# Patient Record
Sex: Female | Born: 1980 | Race: White | Hispanic: No | Marital: Married | Smoking: Never smoker
Health system: Southern US, Community
[De-identification: ages and names within clinical notes are randomized; demographics above are authoritative.]

## PROBLEM LIST (undated history)

## (undated) DIAGNOSIS — E559 Vitamin D deficiency, unspecified: Secondary | ICD-10-CM

## (undated) DIAGNOSIS — I10 Essential (primary) hypertension: Secondary | ICD-10-CM

## (undated) DIAGNOSIS — F32A Depression, unspecified: Secondary | ICD-10-CM

## (undated) DIAGNOSIS — F419 Anxiety disorder, unspecified: Secondary | ICD-10-CM

## (undated) DIAGNOSIS — D649 Anemia, unspecified: Secondary | ICD-10-CM

## (undated) HISTORY — PX: TONSILLECTOMY: SUR1361

## (undated) HISTORY — PX: MYRINGOTOMY: SHX2060

---
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
## 2009-08-16 ENCOUNTER — Ambulatory Visit: Payer: Self-pay | Admitting: Family Medicine

## 2009-08-16 DIAGNOSIS — J45909 Unspecified asthma, uncomplicated: Secondary | ICD-10-CM | POA: Insufficient documentation

## 2009-08-16 DIAGNOSIS — J069 Acute upper respiratory infection, unspecified: Secondary | ICD-10-CM | POA: Insufficient documentation

## 2009-08-16 DIAGNOSIS — I10 Essential (primary) hypertension: Secondary | ICD-10-CM | POA: Insufficient documentation

## 2009-10-28 ENCOUNTER — Emergency Department (HOSPITAL_BASED_OUTPATIENT_CLINIC_OR_DEPARTMENT_OTHER): Admission: EM | Admit: 2009-10-28 | Discharge: 2009-10-28 | Payer: Self-pay | Admitting: Emergency Medicine

## 2010-09-20 ENCOUNTER — Ambulatory Visit (INDEPENDENT_AMBULATORY_CARE_PROVIDER_SITE_OTHER): Payer: 59 | Admitting: Emergency Medicine

## 2010-09-20 ENCOUNTER — Encounter: Payer: Self-pay | Admitting: Emergency Medicine

## 2010-09-20 DIAGNOSIS — J069 Acute upper respiratory infection, unspecified: Secondary | ICD-10-CM

## 2010-09-27 NOTE — Assessment & Plan Note (Signed)
Summary: Possible Sinus Infection/NH  History of Present Illness History from: patient History of Present Illness: 30 Years Old Female complains of onset of cold symptoms for 5 days.  Teresa Navarro has been using Mucinex & Delsym which is helping a little bit. ++ sore throat + cough No pleuritic pain No wheezing + nasal congestion + post-nasal drainage ++ sinus pain/pressure No chest congestion No itchy/red eyes No earache No hemoptysis No SOB No chills/sweats No fever + nausea + vomiting No abdominal pain No diarrhea No skin rashes No fatigue No myalgias No headache     Allergies: 1)  ! * Seasonal  Physical Exam  General:  Well-developed,well-nourished,in no acute distress; alert,appropriate and cooperative throughout examination Head:  maxillary sinus tenderness Ears:  External ear exam shows no significant lesions or deformities.  Otoscopic examination reveals clear canals, tympanic membranes are intact bilaterally without bulging, retraction, inflammation or discharge. Hearing is grossly normal bilaterally. Nose:  Clear discharge Mouth:  OP mild erythema, no exudates, +clear PND Neck:  tender ant cerv LAD Lungs:  Normal respiratory effort, chest expands symmetrically. Lungs are clear to auscultation, no crackles or wheezes. Heart:  Normal rate and regular rhythm. S1 and S2 normal without gallop, murmur, click, rub or other extra sounds.  Assessment  Assessed URI as comment only - Hoyt Koch MD New Problems: URI (ICD-465.9)   Plan New Medications/Changes: AMOXICILLIN 875 MG TABS (AMOXICILLIN) 1 by mouth two times a day fro 7 days  #14 x 0, 09/20/2010, Hoyt Koch MD  New Orders: Est. Patient Level III [phone]  The patient and/or caregiver has been counseled thoroughly with regard to medications prescribed including dosage, schedule, interactions, rationale for use, and possible side effects and they verbalize understanding.  Diagnoses and expected  course of recovery discussed and will return if not improved as expected or if the condition worsens. Patient and/or caregiver verbalized understanding.    Impression & Recommendations:  Problem # 1:  URI (ICD-465.9) 1)  Take the prescribed antibiotic as instructed. 2)  Use nasal saline solution (over the counter) at least 3 times a day. 3)  Use over the counter decongestants like Zyrtec-D every 12 hours as needed to help with congestion. 4)  Can take tylenol every 6 hours or motrin every 8 hours for pain or fever. 5)  Follow up with your primary doctor  if no improvement in 5-7 days, sooner if increasing pain, fever, or new symptoms.    Complete Medication List: 1)  Propranolol Hcl 80 Mg Tabs (Propranolol hcl) .... Bid 2)  Azithromycin 250 Mg Tabs (Azithromycin) .... Two tabs by mouth on day 1, then 1 tab daily on days 2 through 5 3)  Amoxicillin 875 Mg Tabs (Amoxicillin) .Marland Kitchen.. 1 by mouth two times a day fro 7 days Prescriptions: AMOXICILLIN 875 MG TABS (AMOXICILLIN) 1 by mouth two times a day fro 7 days  #14 x 0   Entered and Authorized by:   Hoyt Koch MD   Signed by:   Hoyt Koch MD on 09/20/2010   Method used:   Print then Give to Patient   RxID:   [id number]    Orders Added: 1)  Est. Patient Level III [95621]  Appended Document: Possible Sinus Infection/NH Vital sign: BP- 141/84 LT arm LC sitting P-101 T-98.5 R-18  HT- 63" WT- 211.8  Hx of Diabetes, HBP  Fam Hx of : diabetes, HBP, MI  No current meds No allergies

## 2010-12-12 ENCOUNTER — Inpatient Hospital Stay (INDEPENDENT_AMBULATORY_CARE_PROVIDER_SITE_OTHER)
Admission: RE | Admit: 2010-12-12 | Discharge: 2010-12-12 | Disposition: A | Discharge status: Home / Self Care | Payer: 59 | Source: Ambulatory Visit | Attending: Family Medicine | Admitting: Family Medicine

## 2010-12-12 ENCOUNTER — Encounter: Payer: Self-pay | Admitting: Family Medicine

## 2010-12-12 DIAGNOSIS — H6690 Otitis media, unspecified, unspecified ear: Secondary | ICD-10-CM

## 2010-12-12 DIAGNOSIS — J069 Acute upper respiratory infection, unspecified: Secondary | ICD-10-CM

## 2011-07-23 NOTE — Progress Notes (Signed)
Summary: POSSIBLE EAR INFECTION LEFT SIDE? NH rm 4   Vital Signs:  Patient Profile:   30 Years Old Female CC:      cold s/s, L T ear ache x 1 day Height:     63 inches Weight:      211.75 pounds O2 Sat:      99 % O2 treatment:    Room Air Temp:     98.5 degrees F oral Pulse rate:   109 / minute Resp:     18 per minute BP sitting:   144 / 95  (left arm) Cuff size:   large  Vitals Entered By: Clemens Catholic LPN (December 12, 2010 12:53 PM)                  Updated Prior Medication List: No Medications Current Allergies (reviewed today): ! * SEASONALHistory of Present Illness Chief Complaint: cold s/s, L T ear ache x 1 day History of Present Illness:  Subjective: Patient complains of mild URI symptoms for about 4 days.  At 4:30 PM yesterday she developed a left earache + mild sore throat this morning + cough No pleuritic pain No wheezing + nasal congestion ? post-nasal drainage + sinus pain/pressure No itchy/red eyes No hemoptysis No SOB No fever/chills No nausea No vomiting No abdominal pain No diarrhea No skin rashes + fatigue No myalgias No headache    REVIEW OF SYSTEMS Constitutional Symptoms      Denies fever, chills, night sweats, weight loss, weight gain, and fatigue.  Eyes       Denies change in vision, eye pain, eye discharge, glasses, contact lenses, and eye surgery. Ear/Nose/Throat/Mouth       Complains of change in hearing, ear pain, and sore throat.      Denies hearing loss/aids, ear discharge, dizziness, frequent runny nose, frequent nose bleeds, sinus problems, hoarseness, and tooth pain or bleeding.      Comments: hx of ear tubes Respiratory       Complains of dry cough.      Denies productive cough, wheezing, shortness of breath, asthma, bronchitis, and emphysema/COPD.  Cardiovascular       Denies murmurs, chest pain, and tires easily with exhertion.    Gastrointestinal       Denies stomach pain, nausea/vomiting, diarrhea, constipation,  blood in bowel movements, and indigestion. Genitourniary       Denies painful urination, kidney stones, and loss of urinary control. Neurological       Denies paralysis, seizures, and fainting/blackouts. Musculoskeletal       Denies muscle pain, joint pain, joint stiffness, decreased range of motion, redness, swelling, muscle weakness, and gout.  Skin       Denies bruising, unusual mles/lumps or sores, and hair/skin or nail changes.  Psych       Denies mood changes, temper/anger issues, anxiety/stress, speech problems, depression, and sleep problems. Other Comments: pt c/o cold s/s, runny nose, head congestion, dry cough, LT ear ache x 1 day. she has a hx of ear infections. she applied a heating pad to her LT ear and took IBF last night.    Past History:  Past Medical History: Reviewed history from 08/16/2009 and no changes required. Asthma-exercise induced Hypertension  Past Surgical History: tubes in ears 89  Tonsillectomy  Family History: Reviewed history from 08/16/2009 and no changes required. Family History Diabetes 1st degree relative-mother Family History Hypertension-mother and father Family History of Cardiovascular disorder  Social History: Single Never Smoked Drug use-no Alcohol  use-no   Objective:  No acute distress  Eyes:  Pupils are equal, round, and reactive to light and accomdation.  Extraocular movement is intact.  Conjunctivae are not inflamed.  Ears:  Canals normal.  Right tympanic membrane normal.  Left tympanic membrane red, bulging, and scarred Nose:   Congested turbinates, worse on left.  No sinus tenderness   Pharynx:  Normal  Neck:  Supple.   Tender shotty posterior nodes are palpated bilaterally.  Lungs:  Clear to auscultation.  Breath sounds are equal.  Heart:  Regular rate and rhythm without murmurs, rubs, or gallops.  Abdomen:  Nontender without masses or hepatosplenomegaly.  Bowel sounds are present.  No CVA or flank tenderness.    Assessment New Problems: UPPER RESPIRATORY INFECTION, ACUTE (ICD-465.9) OTITIS MEDIA, ACUTE, LEFT (ICD-382.9)   Plan New Medications/Changes: NASONEX 50 MCG/ACT SUSP (MOMETASONE FUROATE) Two sprays in each nostril once a day  #17gm x 1, 12/12/2010, Donna Christen MD AMOXICILLIN 875 MG TABS (AMOXICILLIN) One by mouth two times a day  #20 x 0, 12/12/2010, Donna Christen MD  New Orders: Pulse Oximetry (single measurment) [94760] Est. Patient Level III [96045] Planning Comments:   Begin amoxicillin, expectorant/decongestant, cough suppressant at bedtime, Nasonex nasal spray.  Increase fluid intake Followup with ENT if not improving 7 to 10 days   The patient and/or caregiver has been counseled thoroughly with regard to medications prescribed including dosage, schedule, interactions, rationale for use, and possible side effects and they verbalize understanding.  Diagnoses and expected course of recovery discussed and will return if not improved as expected or if the condition worsens. Patient and/or caregiver verbalized understanding.  Prescriptions: NASONEX 50 MCG/ACT SUSP (MOMETASONE FUROATE) Two sprays in each nostril once a day  #17gm x 1   Entered and Authorized by:   Donna Christen MD   Signed by:   Donna Christen MD on 12/12/2010   Method used:   Print then Give to Patient   RxID:   [id number] AMOXICILLIN 875 MG TABS (AMOXICILLIN) One by mouth two times a day  #20 x 0   Entered and Authorized by:   Donna Christen MD   Signed by:   Donna Christen MD on 12/12/2010   Method used:   Print then Give to Patient   RxID:   [id number]   Patient Instructions: 1)  Take Mucinex D (guaifenesin with decongestant) twice daily for congestion. 2)  Increase fluid intake, rest. 3)  May use Afrin nasal spray (or generic oxymetazoline) twice daily for about 5 days.  Also recommend using saline nasal spray several times daily and/or saline nasal irrigation.  Use Nasonex spray after Afrin and  saline spray. 4)  If cough develops at night, may use Delsym Cough Suppressat at bedtime. 5)  Followup with ENT doctor if not improving 7 to 10 days.   Orders Added: 1)  Pulse Oximetry (single measurment) [94760] 2)  Est. Patient Level III [84132]

## 2013-01-15 ENCOUNTER — Encounter: Payer: Self-pay | Admitting: *Deleted

## 2013-01-15 ENCOUNTER — Emergency Department (INDEPENDENT_AMBULATORY_CARE_PROVIDER_SITE_OTHER)
Admission: EM | Admit: 2013-01-15 | Discharge: 2013-01-15 | Disposition: A | Discharge status: Home / Self Care | Payer: 59 | Source: Home / Self Care | Attending: Family Medicine | Admitting: Family Medicine

## 2013-01-15 DIAGNOSIS — J069 Acute upper respiratory infection, unspecified: Secondary | ICD-10-CM

## 2013-01-15 HISTORY — DX: Essential (primary) hypertension: I10

## 2013-01-15 MED ORDER — BENZONATATE 200 MG PO CAPS: 200.0000 mg | ORAL_CAPSULE | Freq: Every day | ORAL | Status: AC

## 2013-01-15 MED ORDER — AMOXICILLIN 875 MG PO TABS: 875.0000 mg | ORAL_TABLET | Freq: Two times a day (BID) | ORAL | Status: AC

## 2013-01-15 MED ORDER — PREDNISONE 20 MG PO TABS: 20.0000 mg | ORAL_TABLET | Freq: Two times a day (BID) | ORAL | Status: AC

## 2013-01-15 NOTE — ED Notes (Signed)
Teresa Navarro c/o cough/congestion x 5 days. Her right ear began hurting x 2 days. Denies fever/

## 2013-01-15 NOTE — ED Provider Notes (Signed)
History     CSN: [id number]  Arrival date & time 01/15/13  1436   First MD Initiated Contact with Patient 01/15/13 1446      Chief Complaint  Patient presents with  . Cough  . Otalgia       HPI Comments: Patient developed hoarseness and sinus congestion about 6 days ago.  Minimal sore throat.  She developed a mild cough about 2.3 days ago.  No fever.  She now has a sensation of fullness in her right ear. She states that she had been treated for hypertension in the past but did not remain on the medication.  She notes that she has a history of "white coat hypertension."  She also has a past history of mild asthma.  The history is provided by the patient.    Past Medical History  Diagnosis Date  . Hypertension     untrerated    Past Surgical History  Procedure Laterality Date  . Myringotomy    . Tonsillectomy      Family History  Problem Relation Age of Onset  . Diabetes Mother   . Hypertension Father     History  Substance Use Topics  . Smoking status: Never Smoker   . Smokeless tobacco: Never Used  . Alcohol Use: No    OB History   Grav Para Term Preterm Abortions TAB SAB Ect Mult Living                  Review of Systems + mild sore throat + cough No pleuritic pain No wheezing + nasal congestion + post-nasal drainage No sinus pain/pressure No itchy/red  eyes ? right earache No hemoptysis No SOB No fever, + chills No nausea No vomiting No abdominal pain No diarrhea No urinary symptoms No skin rashes + fatigue No myalgias No headache Used OTC meds without relief  Allergies  Review of patient's allergies indicates no known allergies.  Home Medications   Current Outpatient Rx  Name  Route  Sig  Dispense  Refill  . amoxicillin (AMOXIL) 875 MG tablet   Oral   Take 1 tablet (875 mg total) by mouth 2 (two) times daily. (Rx void after 01/22/13)   14 tablet   0   . benzonatate (TESSALON) 200 MG capsule   Oral   Take 1 capsule (200 mg total) by mouth at bedtime.   12 capsule   0   . predniSONE (DELTASONE) 20 MG tablet   Oral   Take 1 tablet (20 mg total) by mouth 2 (two) times daily. Take with food.   10 tablet   0     BP 159/106  Pulse 102  Temp(Src) 98.2 F (36.8 C) (Oral)  Resp 16  Ht 5\' 3"  (1.6 m)  Wt 220 lb (99.791 kg)  BMI 38.98 kg/m2  SpO2 99%  LMP 12/27/2012  Physical Exam Nursing notes and Vital Signs reviewed. Appearance:  Patient appears stated age, and in no acute distress.  Patient is obese (BMI 39.0) Eyes:  Pupils are equal, round, and reactive to light and accomodation.  Extraocular movement is intact.  Conjunctivae are not inflamed  Ears:  Canals normal.  Tympanic membranes somewhat scarred but otherwise normal.  Nose:  Moderately congested turbinates.  No sinus tenderness.   Pharynx:  Normal Neck:  Supple.  Slightly tender shotty anterior/posterior nodes are palpated bilaterally  Lungs:  Clear to auscultation.  Breath sounds are equal.  Heart:  Regular rate and rhythm without murmurs, rubs, or gallops.  Abdomen:  Nontender without masses or hepatosplenomegaly.  Bowel sounds are present.  No CVA or flank tenderness.  Extremities:  No edema.  No calf tenderness Skin:  No rash present.   ED Course  Procedures  none  Labs Reviewed -  Tympanogram high peak height left ear; normal right  ear.    1. Acute upper respiratory infections of unspecified site       MDM  There is no evidence of bacterial infection today.   Begin prednisone burst.  Prescription written for Benzonatate (Tessalon) to take at bedtime for night-time cough.  Take plain Mucinex (guaifenesin) twice daily for cough and congestion.  Increase fluid intake, rest. May use Afrin nasal spray (or generic oxymetazoline) twice daily for about 5  days.  Also recommend using saline nasal spray several times daily and saline nasal irrigation (AYR is a common brand) Stop all antihistamines for now, and other non-prescription cough/cold preparations. Begin Amoxicillin if not improving about one week, if earache develops, or if persistent fever develops (Given a prescription to hold, with an expiration date)  Follow-up with family doctor if not improving about 10 days.  Check Blood pressure several times weekly and follow-up with family doctor if remains over 120/80.        Lattie Haw, MD 01/15/13 [phone]

## 2019-12-27 ENCOUNTER — Other Ambulatory Visit: Payer: Self-pay

## 2019-12-27 ENCOUNTER — Encounter (HOSPITAL_COMMUNITY): Payer: Self-pay | Admitting: Emergency Medicine

## 2019-12-27 ENCOUNTER — Emergency Department (HOSPITAL_COMMUNITY)
Admission: EM | Admit: 2019-12-27 | Discharge: 2019-12-28 | Disposition: A | Discharge status: Home / Self Care | Payer: BC Managed Care – PPO | Attending: Emergency Medicine | Admitting: Emergency Medicine

## 2019-12-27 DIAGNOSIS — K529 Noninfective gastroenteritis and colitis, unspecified: Secondary | ICD-10-CM | POA: Insufficient documentation

## 2019-12-27 DIAGNOSIS — R109 Unspecified abdominal pain: Secondary | ICD-10-CM | POA: Diagnosis not present

## 2019-12-27 DIAGNOSIS — R112 Nausea with vomiting, unspecified: Secondary | ICD-10-CM | POA: Diagnosis present

## 2019-12-27 LAB — CBC
HCT: 44.5 % (ref 36.0–46.0)
Hemoglobin: 14.6 g/dL (ref 12.0–15.0)
MCH: 30.5 pg (ref 26.0–34.0)
MCHC: 32.8 g/dL (ref 30.0–36.0)
MCV: 93.1 fL (ref 80.0–100.0)
Platelets: 249 10*3/uL (ref 150–400)
RBC: 4.78 MIL/uL (ref 3.87–5.11)
RDW: 12.2 % (ref 11.5–15.5)
WBC: 9.4 10*3/uL (ref 4.0–10.5)
nRBC: 0 % (ref 0.0–0.2)

## 2019-12-27 LAB — I-STAT BETA HCG BLOOD, ED (MC, WL, AP ONLY): I-stat hCG, quantitative: <5 m[IU]/mL (ref <5)

## 2019-12-27 MED ORDER — SODIUM CHLORIDE 0.9% FLUSH: 3.0000 mL | Freq: Once | INTRAVENOUS | Status: DC

## 2019-12-27 MED ORDER — ONDANSETRON 4 MG PO TBDP
4.0000 mg | ORAL_TABLET | Freq: Once | ORAL | Status: AC | PRN
  Administered 2019-12-27: 4 mg via ORAL
  Filled 2019-12-27: qty 1

## 2019-12-27 NOTE — ED Triage Notes (Signed)
Pt c/o abd pain with nausea and vomiting that started today.

## 2019-12-28 ENCOUNTER — Emergency Department (HOSPITAL_COMMUNITY): Payer: BC Managed Care – PPO

## 2019-12-28 LAB — COMPREHENSIVE METABOLIC PANEL
ALT: 17 U/L (ref 0–44)
AST: 19 U/L (ref 15–41)
Albumin: 3.9 g/dL (ref 3.5–5.0)
Alkaline Phosphatase: 56 U/L (ref 38–126)
Anion gap: 10 (ref 5–15)
BUN: 12 mg/dL (ref 6–20)
CO2: 16 mmol/L — ABNORMAL LOW (ref 22–32)
Calcium: 8.3 mg/dL — ABNORMAL LOW (ref 8.9–10.3)
Chloride: 109 mmol/L (ref 98–111)
Creatinine, Ser: 0.99 mg/dL (ref 0.44–1.00)
GFR calc Af Amer: >60 mL/min (ref >60)
GFR calc non Af Amer: >60 mL/min (ref >60)
Glucose, Bld: 166 mg/dL — ABNORMAL HIGH (ref 70–99)
Potassium: 3.9 mmol/L (ref 3.5–5.1)
Sodium: 135 mmol/L (ref 135–145)
Total Bilirubin: 0.6 mg/dL (ref 0.3–1.2)
Total Protein: 7 g/dL (ref 6.5–8.1)

## 2019-12-28 LAB — URINALYSIS, ROUTINE W REFLEX MICROSCOPIC
Bilirubin Urine: NEGATIVE
Glucose, UA: NEGATIVE mg/dL
Hgb urine dipstick: NEGATIVE
Ketones, ur: NEGATIVE mg/dL
Leukocytes,Ua: NEGATIVE
Nitrite: NEGATIVE
Protein, ur: 100 mg/dL — AB
Specific Gravity, Urine: 1.044 — ABNORMAL HIGH (ref 1.005–1.030)
pH: 5 (ref 5.0–8.0)

## 2019-12-28 LAB — LIPASE, BLOOD: Lipase: 19 U/L (ref 11–51)

## 2019-12-28 MED ORDER — SODIUM CHLORIDE 0.9 % IV BOLUS
1000.0000 mL | Freq: Once | INTRAVENOUS | Status: AC
  Administered 2019-12-28: 1000 mL via INTRAVENOUS

## 2019-12-28 MED ORDER — SODIUM CHLORIDE 0.9 % IV BOLUS: 1000.0000 mL | Freq: Once | INTRAVENOUS | Status: DC

## 2019-12-28 MED ORDER — ONDANSETRON 4 MG PO TBDP
4.0000 mg | ORAL_TABLET | Freq: Three times a day (TID) | ORAL | 0 refills | Status: AC | PRN
Start: 2019-12-28 — End: ?

## 2019-12-28 MED ORDER — ONDANSETRON HCL 4 MG/2ML IJ SOLN
4.0000 mg | Freq: Once | INTRAMUSCULAR | Status: DC
  Filled 2019-12-28: qty 2

## 2019-12-28 MED ORDER — IOHEXOL 300 MG/ML  SOLN
100.0000 mL | Freq: Once | INTRAMUSCULAR | Status: AC | PRN
  Administered 2019-12-28: 100 mL via INTRAVENOUS

## 2019-12-28 NOTE — ED Notes (Signed)
All discharge instructions reviewed including medications and follow up appointments. Pt verbalizes understanding of instructions

## 2019-12-28 NOTE — ED Provider Notes (Signed)
[facility] EMERGENCY DEPARTMENT Provider Note   CSN: [id number] Arrival date & time: 12/27/19  2200     History Chief Complaint  Patient presents with  . Abdominal Pain    Teresa Navarro is a 39 y.o. female.  Patient presents with nausea, vomiting, diarrhea that onset about 3:30 PM.  States she had some "indigestion" yesterday.  Today she developed multiple episodes of nonbloody nonbilious emesis and loose stools.  Does think she vomited about 7 or 8 times and had that many loose stools as well.  She denies any sick contacts or recent travel.  No suspicious food intake.  No fever.  She is currently on amoxicillin for an ear infection and has been on it for approximately 7 days.  Denies any pain with urination or blood in the urine.  No trouble breathing or chest pain.  Denies any possibility of pregnancy.  Still has appendix and gallbladder.  Feels sharp lower abdominal cramping in her right side of her abdomen that comes and goes lasting for several minutes at a time.  He is not had this pain previously.  No one else has been sick around her.  The history is provided by the patient.  Abdominal Pain Associated symptoms: diarrhea, nausea and vomiting   Associated symptoms: no chest pain, no cough, no dysuria, no fatigue, no fever, no hematuria and no shortness of breath        Past Medical History:  Diagnosis Date  . Hypertension    untrerated    Patient Active Problem List   Diagnosis Date Noted  . HYPERTENSION 08/16/2009  . URI 08/16/2009  . ASTHMA 08/16/2009    Past Surgical History:  Procedure Laterality Date  . MYRINGOTOMY    . TONSILLECTOMY       OB History   No obstetric history on file.     Family History  Problem Relation Age of Onset  . Diabetes Mother   . Hypertension Father     Social History   Tobacco Use  . Smoking status: Never Smoker  . Smokeless tobacco: Never Used  Substance Use Topics  . Alcohol use: No  . Drug use:  No    Home Medications Prior to Admission medications   Medication Sig Start Date End Date Taking? Authorizing Provider  amoxicillin (AMOXIL) 875 MG tablet Take 1 tablet (875 mg total) by mouth 2 (two) times daily. (Rx void after 01/22/13) 01/15/13   Lattie Haw, MD  benzonatate (TESSALON) 200 MG capsule Take 1 capsule (200 mg total) by mouth at bedtime. 01/15/13   Lattie Haw, MD  predniSONE (DELTASONE) 20 MG tablet Take 1 tablet (20 mg total) by mouth 2 (two) times daily. Take with food. 01/15/13   Lattie Haw, MD    Allergies    Patient has no known allergies.  Review of Systems   Review of Systems  Constitutional: Positive for activity change and appetite change. Negative for fatigue and fever.  HENT: Negative for congestion and rhinorrhea.   Respiratory: Negative for cough and shortness of breath.   Cardiovascular: Negative for chest pain.  Gastrointestinal: Positive for abdominal pain, diarrhea, nausea and vomiting.  Genitourinary: Negative for dysuria and hematuria.  Musculoskeletal: Negative for arthralgias and myalgias.  Skin: Negative for rash.  Neurological: Negative for dizziness, weakness and headaches.    all other systems are negative except as noted in the HPI and PMH.   Physical Exam Updated Vital Signs BP 138/72   Pulse  88   Temp 98.6 F (37 C)   Resp 17   Ht 5\' 3"  (1.6 m)   Wt 111.1 kg   SpO2 98%   BMI 43.40 kg/m   Physical Exam Vitals and nursing note reviewed.  Constitutional:      General: She is not in acute distress.    Appearance: She is well-developed.  HENT:     Head: Normocephalic and atraumatic.     Mouth/Throat:     Pharynx: No oropharyngeal exudate.  Eyes:     Conjunctiva/sclera: Conjunctivae normal.     Pupils: Pupils are equal, round, and reactive to light.  Neck:     Comments: No meningismus. Cardiovascular:     Rate and Rhythm: Normal rate and regular rhythm.     Heart sounds: Normal heart sounds. No murmur.    Pulmonary:     Effort: Pulmonary effort is normal. No respiratory distress.     Breath sounds: Normal breath sounds.  Abdominal:     Palpations: Abdomen is soft.     Tenderness: There is abdominal tenderness. There is no guarding or rebound.     Comments: Mild right-sided lower abdominal tenderness, no guarding or rebound  Musculoskeletal:        General: No tenderness. Normal range of motion.     Cervical back: Normal range of motion and neck supple.     Comments: No paraspinal lumbar tenderness  5/5 strength in bilateral lower extremities. Ankle plantar and dorsiflexion intact. Great toe extension intact bilaterally. +2 DP and PT pulses. +2 patellar reflexes bilaterally. Normal gait.   Skin:    General: Skin is warm.  Neurological:     Mental Status: She is alert and oriented to person, place, and time.     Cranial Nerves: No cranial nerve deficit.     Motor: No abnormal muscle tone.     Coordination: Coordination normal.     Comments:  5/5 strength throughout. CN 2-12 intact.Equal grip strength.   Psychiatric:        Behavior: Behavior normal.     ED Results / Procedures / Treatments   Labs (all labs ordered are listed, but only abnormal results are displayed) Labs Reviewed  COMPREHENSIVE METABOLIC PANEL - Abnormal; Notable for the following components:      Result Value   CO2 16 (*)    Glucose, Bld 166 (*)    Calcium 8.3 (*)    All other components within normal limits  URINALYSIS, ROUTINE W REFLEX MICROSCOPIC - Abnormal; Notable for the following components:   APPearance HAZY (*)    Specific Gravity, Urine 1.044 (*)    Protein, ur 100 (*)    Bacteria, UA RARE (*)    All other components within normal limits  C DIFFICILE QUICK SCREEN W PCR REFLEX  LIPASE, BLOOD  CBC  I-STAT BETA HCG BLOOD, ED ([facility], [facility], [facility] ONLY)    EKG None  Radiology CT ABDOMEN PELVIS W CONTRAST  Result Date: 12/28/2019 CLINICAL DATA:  Right upper quadrant abdominal pain. Nausea and  vomiting. EXAM: CT ABDOMEN AND PELVIS WITH CONTRAST TECHNIQUE: Multidetector CT imaging of the abdomen and pelvis was performed using the standard protocol following bolus administration of intravenous contrast. CONTRAST:  02/27/2020 OMNIPAQUE IOHEXOL 300 MG/ML  SOLN COMPARISON:  None. FINDINGS: Lower chest: The lung bases are clear without focal nodule, mass, or airspace disease. The heart size is normal. No significant pleural or pericardial effusion is present. Hepatobiliary: Fatty infiltration liver is evident without focal nodule  or mass. The common bile duct and gallbladder are normal. Pancreas: Unremarkable. No pancreatic ductal dilatation or surrounding inflammatory changes. Spleen: Insert normal spleen Adrenals/Urinary Tract: Adrenal glands are within normal limits bilaterally. Central sinus cyst is present at the lower pole of the left kidney. Kidneys and ureters are otherwise within normal limits. The urinary bladder is normal. Stomach/Bowel: Stomach and duodenum are within normal limits. The small bowel is within normal limits. Terminal ileum is unremarkable. The appendix is visualized, gas filled, and normal. The ascending and transverse colon are within normal limits. The descending and sigmoid colon are normal. Vascular/Lymphatic: No significant vascular findings are present. No enlarged abdominal or pelvic lymph nodes. Reproductive: Uterus and bilateral adnexa are unremarkable. Other: No abdominal wall hernia or abnormality. No abdominopelvic ascites. Musculoskeletal: Bilateral L5 pars defects are present. Grade 2 anterolisthesis measures 10 mm. Slight retrolisthesis is present at L4-5. Left greater than right foraminal stenosis is present at L5-S1. Alignment is otherwise anatomic. No focal lytic or blastic lesions are present. The bony pelvis is within normal limits. Hips are located and normal. IMPRESSION: 1. No acute or focal lesion to explain the patient's symptoms. 2. Fatty infiltration liver. 3.  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 4. Appendix is visualized and normal. Electronically Signed   By: Marin Roberts M.D.   On: 12/28/2019 04:18    Procedures Procedures (including critical care time)  Medications Ordered in ED Medications  sodium chloride flush (NS) 0.9 % injection 3 mL (has no administration in time range)  sodium chloride 0.9 % bolus 1,000 mL (has no administration in time range)  ondansetron (ZOFRAN) injection 4 mg (has no administration in time range)  ondansetron (ZOFRAN-ODT) disintegrating tablet 4 mg (4 mg Oral Given 12/27/19 2235)    ED Course  I have reviewed the triage vital signs and the nursing notes.  Pertinent labs & imaging results that were available during my care of the patient were reviewed by me and considered in my medical decision making (see chart for details).    MDM Rules/Calculators/A&P                     11 hours of nausea, vomiting, diarrhea with abdominal cramping.  Vitals are stable.  No fever.  Abdomen soft with right-sided abdominal tenderness, no peritoneal signs.  No upper abdominal pain.  hCG is negative.  Patient will be hydrated and labs will be checked.  Will attempt to check C. difficile given her recent antibiotic use  Bicarb 16 with normal anion gap, likely in setting of diarrhea. No ketones in urine.   CT scan results discussed with patient.  Her appendix appears normal.  She is made aware of the anterolisthesis of L5 and S1.  She has intermittent back pain but none currently.  Denies any focal weakness, numbness, tingling.  No bowel or bladder incontinence.  No fever or vomiting. States she has never had an MRI of her back.  No recent falls or trauma. No evidence of cord compression or cauda equina.   Suspect likely viral gastroenteritis.  Patient unable to give a stool sample.  Cannot evaluate for C. difficile.  No evidence of colitis on CT  scan however. Significant other reports his son ate some strawberries 2 days ago which made him sick and that patient had these as well.  Tolerating PO in the ED without further vomiting or diarrhea.  Treat supportively for suspected  viral gastroenteritis. Made aware of CT results and followup information given for neurosurgery.  Return precautions discussed.  Final Clinical Impression(s) / ED Diagnoses Final diagnoses:  Gastroenteritis    Rx / DC Orders ED Discharge Orders    None       Leonora Gores, Annie Main, MD 12/28/19 [phone]

## 2019-12-28 NOTE — Discharge Instructions (Addendum)
Keep yourself hydrated. Use the nausea medication as needed. Followup with your doctor. You have grade 2 anterolisthesis and left greater than right foraminal stenosis at L5-S1 in your low back and should followup with your doctor. Return to the ED with worsening pain, unable to eat or drink, any other concerns.

## 2019-12-28 NOTE — ED Notes (Signed)
This nurse gave pt fluids for PO challenge. Pt had no N/V. This nurse also ambulated pt without SOB, dizziness, CP.

## 2020-06-14 ENCOUNTER — Other Ambulatory Visit: Payer: Self-pay | Admitting: Neurological Surgery

## 2020-07-20 NOTE — Progress Notes (Signed)
[id number] - [place], [place] - [address] [place] [address] Phone: [phone] Fax: [phone]      Your procedure is scheduled on 07/25/2020.  Report to [facility] Main Entrance "A" at 05:30 A.M., and check in at the Admitting office.  Call this number if you have problems the morning of surgery:  [phone]  Call [phone] if you have any questions prior to your surgery date Monday-Friday 8am-4pm    Remember:  Do not eat or drink after midnight the night before your surgery     Take these medicines the morning of surgery with A SIP OF WATER  escitalopram (LEXAPRO) albuterol (VENTOLIN HFA)- take if needed  As of today, STOP taking any Aspirin (unless otherwise instructed by your surgeon) Aleve, Naproxen, Ibuprofen, Motrin, Advil, Goody's, BC's, all herbal medications, fish oil, and all vitamins.                      Do not wear jewelry, make up, or nail polish            Do not wear lotions, powders, perfumes/colognes, or deodorant.            Do not shave 48 hours prior to surgery.              Do not bring valuables to the hospital.            [facility] is not responsible for any belongings or valuables.  Do NOT Smoke (Tobacco/Vaping) or drink Alcohol 24 hours prior to your procedure If you use a CPAP at night, you may bring all equipment for your overnight stay.   Contacts, glasses, dentures or bridgework may not be worn into surgery.      For patients admitted to the hospital, discharge time will be determined by your treatment team.   Patients discharged the day of surgery will not be allowed to drive home, and someone needs to stay with them for 24 hours.    Special instructions:   [facility]- Preparing For Surgery  Before surgery, you can play an important role. Because skin is not sterile, your skin needs to be as free of germs as possible. You can reduce the number of germs on your skin by washing with CHG  (chlorahexidine gluconate) Soap before surgery.  CHG is an antiseptic cleaner which kills germs and bonds with the skin to continue killing germs even after washing.    Oral Hygiene is also important to reduce your risk of infection.  Remember - BRUSH YOUR TEETH THE MORNING OF SURGERY WITH YOUR REGULAR TOOTHPASTE  Please do not use if you have an allergy to CHG or antibacterial soaps. If your skin becomes reddened/irritated stop using the CHG.  Do not shave (including legs and underarms) for at least 48 hours prior to first CHG shower. It is OK to shave your face.  Please follow these instructions carefully.   1. Shower the NIGHT BEFORE SURGERY and the MORNING OF SURGERY with CHG Soap.   2. If you chose to wash your hair, wash your hair first as usual with your normal shampoo.  3. After you shampoo, rinse your hair and body thoroughly to remove the shampoo.  4. Use CHG as you would any other liquid soap. You can apply CHG directly to the skin and wash gently with a scrungie or a clean washcloth.   5. Apply the CHG Soap to your body  ONLY FROM THE NECK DOWN.  Do not use on open wounds or open sores. Avoid contact with your eyes, ears, mouth and genitals (private parts). Wash Face and genitals (private parts)  with your normal soap.   6. Wash thoroughly, paying special attention to the area where your surgery will be performed.  7. Thoroughly rinse your body with warm water from the neck down.  8. DO NOT shower/wash with your normal soap after using and rinsing off the CHG Soap.  9. Pat yourself dry with a CLEAN TOWEL.  10. Wear CLEAN PAJAMAS to bed the night before surgery  11. Place CLEAN SHEETS on your bed the night of your first shower and DO NOT SLEEP WITH PETS.   Day of Surgery: Shower with CHG.  Wear Clean/Comfortable clothing the morning of surgery Do not apply any deodorants/lotions.   Remember to brush your teeth WITH YOUR REGULAR TOOTHPASTE.   Please read over the  following fact sheets that you were given.

## 2020-07-21 ENCOUNTER — Other Ambulatory Visit: Payer: Self-pay

## 2020-07-21 ENCOUNTER — Encounter (HOSPITAL_COMMUNITY): Payer: Self-pay

## 2020-07-21 ENCOUNTER — Other Ambulatory Visit (HOSPITAL_COMMUNITY): Payer: BC Managed Care – PPO

## 2020-07-21 ENCOUNTER — Encounter (HOSPITAL_COMMUNITY)
Admission: RE | Admit: 2020-07-21 | Discharge: 2020-07-21 | Disposition: A | Discharge status: Home / Self Care | Payer: BC Managed Care – PPO | Source: Ambulatory Visit | Attending: Neurological Surgery | Admitting: Neurological Surgery

## 2020-07-21 DIAGNOSIS — Z01818 Encounter for other preprocedural examination: Secondary | ICD-10-CM | POA: Diagnosis not present

## 2020-07-21 HISTORY — DX: Depression, unspecified: F32.A

## 2020-07-21 HISTORY — DX: Vitamin D deficiency, unspecified: E55.9

## 2020-07-21 HISTORY — DX: Anxiety disorder, unspecified: F41.9

## 2020-07-21 HISTORY — DX: Anemia, unspecified: D64.9

## 2020-07-21 LAB — CBC
HCT: 40.7 % (ref 36.0–46.0)
Hemoglobin: 13.5 g/dL (ref 12.0–15.0)
MCH: 31.2 pg (ref 26.0–34.0)
MCHC: 33.2 g/dL (ref 30.0–36.0)
MCV: 94 fL (ref 80.0–100.0)
Platelets: 266 10*3/uL (ref 150–400)
RBC: 4.33 MIL/uL (ref 3.87–5.11)
RDW: 12.5 % (ref 11.5–15.5)
WBC: 6.4 10*3/uL (ref 4.0–10.5)
nRBC: 0 % (ref 0.0–0.2)

## 2020-07-21 LAB — BASIC METABOLIC PANEL
Anion gap: 10 (ref 5–15)
BUN: 9 mg/dL (ref 6–20)
CO2: 26 mmol/L (ref 22–32)
Calcium: 9.1 mg/dL (ref 8.9–10.3)
Chloride: 101 mmol/L (ref 98–111)
Creatinine, Ser: 0.63 mg/dL (ref 0.44–1.00)
GFR, Estimated: >60 mL/min (ref >60)
Glucose, Bld: 104 mg/dL — ABNORMAL HIGH (ref 70–99)
Potassium: 4.2 mmol/L (ref 3.5–5.1)
Sodium: 137 mmol/L (ref 135–145)

## 2020-07-21 LAB — SURGICAL PCR SCREEN
MRSA, PCR: NEGATIVE
Staphylococcus aureus: POSITIVE — AB

## 2020-07-21 NOTE — Progress Notes (Signed)
Spoke with patient to confirm she had spoke with Dr. Tonia Brooms office about her upcoming surgery and possible current miscarriage. Patient confirmed that Dr. Danielle Dess and her OB/GYN both agreed it was okay to proceed with surgury. Informed Revonda Standard, PA-C as well of update.

## 2020-07-21 NOTE — Progress Notes (Addendum)
PCP - Primus Bravo Cardiologist - denies  PPM/ICD - denies   Chest x-ray - n/a EKG - 07/21/2020 Stress Test - denies ECHO - denies Cardiac Cath - denies  Sleep Study - denies   Patient instructed to hold all Aspirin, NSAID's, herbal medications, fish oil and vitamins 7 days prior to surgery.   ERAS Protcol - no   COVID TEST- 07/22/2020   Anesthesia review: yes, patient had positive pregnancy test on 07/06/2020. Had an ultrasound and determined there was a sac with no egg. Patient is going for a follow up ultrasound with OB/GYN this afternoon. Patient also instructed to ask her OB/GYN about further instruction with her upcoming back surgery. Patient is possibly experiencing a miscarriage and will be seeing her OB/GYN today. She was informed to call Dr. Tonia Brooms office as soon as they open. We attempted to call Dr. Tonia Brooms office with no answer. Unable to leave voicemail.   Patient denies shortness of breath, fever, cough and chest pain at PAT appointment   All instructions explained to the patient, with a verbal understanding of the material. Patient agrees to go over the instructions while at home for a better understanding. Patient also instructed to self quarantine after being tested for COVID-19. The opportunity to ask questions was provided.

## 2020-07-22 ENCOUNTER — Other Ambulatory Visit (HOSPITAL_COMMUNITY)
Admission: RE | Admit: 2020-07-22 | Discharge: 2020-07-22 | Disposition: A | Discharge status: Home / Self Care | Payer: BC Managed Care – PPO | Source: Ambulatory Visit | Attending: Neurological Surgery | Admitting: Neurological Surgery

## 2020-07-22 DIAGNOSIS — Z20822 Contact with and (suspected) exposure to covid-19: Secondary | ICD-10-CM | POA: Insufficient documentation

## 2020-07-22 DIAGNOSIS — Z01812 Encounter for preprocedural laboratory examination: Secondary | ICD-10-CM | POA: Insufficient documentation

## 2020-07-22 LAB — SARS CORONAVIRUS 2 (TAT 6-24 HRS): SARS Coronavirus 2: NEGATIVE

## 2020-07-22 NOTE — Progress Notes (Addendum)
Anesthesia Chart Review:  Case: [id number] Date/Time: 07/25/20 0715   Procedure: Lumbar 4-5 Lumbar 5-Sacral 1 Posterior lumbar interbody fusion (N/A Back) - 3C/RM 21   Anesthesia type: General   Pre-op diagnosis: Spondylolisthesis, Lumbosacral region   Location: [facility] ROOM 21 / [facility] OR   Surgeons: Barnett Abu, MD      DISCUSSION: Patient is a 39 year old female scheduled for the above procedure.  History includes never smoker, HTN (no medications), anemia, anxiety, depression.  Seen by Franky Macho, MD [facility] in [place], phone [phone]; fax [phone]) for first trimester bleeding. Had positive pregnancy test on 07/06/20. Reported had [place] that showed a sac with no embryo. She had begun spotting. She told him about surgery plans. 07/21/20 note received which confirms "Blighted ovum and nonhydatidiform mole". She was prescribed misoprostol 200 mcg 4 tab per vagina X1, may repeat in 24-48 hours. Ibuprofen recommended as needed for pain. Dr. Laveda Norman spoke with Dr. Danielle Dess about treatment plan in hopes she will complete miscarriage before 07/25/20. Patient to contact if any issues over the weekend, and Dr. Verlee Rossetti has Dr. Laveda Norman' cell number if any additional questions/concern arise. Reportedly if she presents with heavy vaginal bleeding then will need to consider delaying surgery. (07/21/20 OB note on chart.)  Presurgical COVID-19 test is scheduled for 07/22/2020. Updated anesthesiologist Karna Christmas, MD regarding available information.  Plan to proceed as scheduled will depend on events over the weekend related to miscarriage. Given her miscarriage, she should not require urine pregnancy test for a 07/25/2020 surgery.   VS: BP (!) 153/93   Pulse 89   Temp 36.7 C (Oral)   Resp 18   Ht 5\' 3"  (1.6 m)   Wt 106.5 kg   SpO2 100%   BMI 41.61 kg/m    PROVIDERS: , NP is PCP    LABS: Labs reviewed: Acceptable for surgery. (all labs ordered are  listed, but only abnormal results are displayed)  Labs Reviewed  SURGICAL PCR SCREEN - Abnormal; Notable for the following components:      Result Value   Staphylococcus aureus POSITIVE (*)    All other components within normal limits  BASIC METABOLIC PANEL - Abnormal; Notable for the following components:   Glucose, Bld 104 (*)    All other components within normal limits  CBC  TYPE AND SCREEN    IMAGES: MRI L-spine 12/28/19 (Report in Canopy/PACS): IMPRESSION: 1. Grade 2 anterolisthesis L5 on S1 secondary to chronic bilateral pars defects with moderate-to-severe bilateral foraminal stenosis at L5-S1. 2. Central disc protrusion at L4-L5 with 6 mm caudal extension of disc material. 3. No canal stenosis at any level.   EKG: 07/21/20: NSR   CV: N/A   Past Medical History:  Diagnosis Date  . Anemia   . Anxiety   . Depression   . Hypertension    untrerated  . Vitamin D deficiency     Past Surgical History:  Procedure Laterality Date  . MYRINGOTOMY    . TONSILLECTOMY      MEDICATIONS: . albuterol (VENTOLIN HFA) 108 (90 Base) MCG/ACT inhaler  . amoxicillin (AMOXIL) 875 MG tablet  . benzonatate (TESSALON) 200 MG capsule  . escitalopram (LEXAPRO) 20 MG tablet  . hydrochlorothiazide (HYDRODIURIL) 25 MG tablet  . metoprolol succinate (TOPROL-XL) 50 MG 24 hr tablet  . ondansetron (ZOFRAN ODT) 4 MG disintegrating tablet  . predniSONE (DELTASONE) 20 MG tablet  . Vitamin D, Ergocalciferol, (DRISDOL) 1.25 MG (50000 UNIT) CAPS capsule  No current facility-administered medications for this encounter.   Not currently taking Zofran, prednisone, Tessalon Perles, amoxicillin.   Shonna Chock, PA-C Surgical Short Stay/Anesthesiology [facility] Phone [phone] [facility] Phone [phone] 07/22/2020 12:34 PM

## 2020-07-22 NOTE — Anesthesia Preprocedure Evaluation (Addendum)
Anesthesia Evaluation  Patient identified by MRN, date of birth, ID band Patient awake    Reviewed: Allergy & Precautions, NPO status , Patient's Chart, lab work & pertinent test results, reviewed documented beta blocker date and time   History of Anesthesia Complications Negative for: history of anesthetic complications  Airway Mallampati: III  TM Distance: >3 FB Neck ROM: Full    Dental  (+) Dental Advisory Given   Pulmonary neg pulmonary ROS,  Covid-19 Nucleic Acid Test Results Lab Results      Component                Value               Date                      SARSCOV2NAA              NEGATIVE            07/22/2020              breath sounds clear to auscultation       Cardiovascular hypertension, Pt. on medications and Pt. on home beta blockers (-) angina(-) Past MI and (-) CHF  Rhythm:Regular     Neuro/Psych PSYCHIATRIC DISORDERS Anxiety Depression Low back pain  Neuromuscular disease    GI/Hepatic negative GI ROS, Neg liver ROS,   Endo/Other  Morbid obesity  Renal/GU negative Renal ROS     Musculoskeletal   Abdominal   Peds  Hematology negative hematology ROS (+) Lab Results      Component                Value               Date                      WBC                      6.4                 07/21/2020                HGB                      13.5                07/21/2020                HCT                      40.7                07/21/2020                MCV                      94.0                07/21/2020                PLT                      266                 07/21/2020              Anesthesia Other  Findings   Reproductive/Obstetrics                            Anesthesia Physical Anesthesia Plan  ASA: II  Anesthesia Plan: General   Post-op Pain Management:    Induction: Intravenous  PONV Risk Score and Plan: 3 and Ondansetron and Dexamethasone  Airway  Management Planned: Oral ETT  Additional Equipment: None  Intra-op Plan:   Post-operative Plan: Extubation in OR  Informed Consent: I have reviewed the patients History and Physical, chart, labs and discussed the procedure including the risks, benefits and alternatives for the proposed anesthesia with the patient or authorized representative who has indicated his/her understanding and acceptance.     Dental advisory given  Plan Discussed with: CRNA and Surgeon  Anesthesia Plan Comments: (See PAT note written 07/22/2020 by Shonna Chock, PA-C.  Patient started on misoprostol 07/21/20 for blighted ovum/nonhydatidifomr mole. OB-GYN Ashley Akin, MD [facility] in [place], phone [phone]; fax [phone]). He is aware of surgery plans and has spoken with Dr. Danielle Dess. Reportedly, will need to consider delay of surgery if she presents with heavy vaginal bleeding. Dr. Danielle Dess has Dr. Heriberto Antigua cell phone number if needed.    )      Anesthesia Quick Evaluation

## 2020-07-25 ENCOUNTER — Encounter (HOSPITAL_COMMUNITY)
Admission: RE | Disposition: A | Discharge status: Home / Self Care | Payer: Self-pay | Source: Home / Self Care | Attending: Neurological Surgery

## 2020-07-25 ENCOUNTER — Inpatient Hospital Stay (HOSPITAL_COMMUNITY): Payer: BC Managed Care – PPO | Admitting: Anesthesiology

## 2020-07-25 ENCOUNTER — Inpatient Hospital Stay (HOSPITAL_COMMUNITY)
Admission: RE | Admit: 2020-07-25 | Discharge: 2020-07-27 | DRG: 454 | Disposition: A | Discharge status: Home / Self Care | Payer: BC Managed Care – PPO | Attending: Neurological Surgery | Admitting: Neurological Surgery

## 2020-07-25 ENCOUNTER — Inpatient Hospital Stay (HOSPITAL_COMMUNITY): Payer: BC Managed Care – PPO | Admitting: Vascular Surgery

## 2020-07-25 ENCOUNTER — Encounter (HOSPITAL_COMMUNITY): Payer: Self-pay | Admitting: Neurological Surgery

## 2020-07-25 ENCOUNTER — Other Ambulatory Visit: Payer: Self-pay

## 2020-07-25 ENCOUNTER — Inpatient Hospital Stay (HOSPITAL_COMMUNITY): Payer: BC Managed Care – PPO

## 2020-07-25 DIAGNOSIS — M4807 Spinal stenosis, lumbosacral region: Secondary | ICD-10-CM | POA: Diagnosis present

## 2020-07-25 DIAGNOSIS — M48062 Spinal stenosis, lumbar region with neurogenic claudication: Principal | ICD-10-CM | POA: Diagnosis present

## 2020-07-25 DIAGNOSIS — F32A Depression, unspecified: Secondary | ICD-10-CM | POA: Diagnosis present

## 2020-07-25 DIAGNOSIS — M4317 Spondylolisthesis, lumbosacral region: Secondary | ICD-10-CM | POA: Diagnosis present

## 2020-07-25 DIAGNOSIS — I10 Essential (primary) hypertension: Secondary | ICD-10-CM | POA: Diagnosis present

## 2020-07-25 DIAGNOSIS — Z8249 Family history of ischemic heart disease and other diseases of the circulatory system: Secondary | ICD-10-CM

## 2020-07-25 DIAGNOSIS — N939 Abnormal uterine and vaginal bleeding, unspecified: Secondary | ICD-10-CM | POA: Diagnosis present

## 2020-07-25 DIAGNOSIS — M5416 Radiculopathy, lumbar region: Secondary | ICD-10-CM | POA: Diagnosis present

## 2020-07-25 DIAGNOSIS — Z6841 Body Mass Index (BMI) 40.0 and over, adult: Secondary | ICD-10-CM

## 2020-07-25 DIAGNOSIS — D649 Anemia, unspecified: Secondary | ICD-10-CM | POA: Diagnosis present

## 2020-07-25 DIAGNOSIS — Z20822 Contact with and (suspected) exposure to covid-19: Secondary | ICD-10-CM | POA: Diagnosis present

## 2020-07-25 DIAGNOSIS — Z419 Encounter for procedure for purposes other than remedying health state, unspecified: Secondary | ICD-10-CM

## 2020-07-25 DIAGNOSIS — F419 Anxiety disorder, unspecified: Secondary | ICD-10-CM | POA: Diagnosis present

## 2020-07-25 DIAGNOSIS — Z833 Family history of diabetes mellitus: Secondary | ICD-10-CM

## 2020-07-25 DIAGNOSIS — M4316 Spondylolisthesis, lumbar region: Secondary | ICD-10-CM | POA: Diagnosis present

## 2020-07-25 DIAGNOSIS — E559 Vitamin D deficiency, unspecified: Secondary | ICD-10-CM | POA: Diagnosis present

## 2020-07-25 LAB — POCT I-STAT EG7
Acid-base deficit: 1 mmol/L (ref 0.0–2.0)
Bicarbonate: 25.2 mmol/L (ref 20.0–28.0)
Calcium, Ion: 1.18 mmol/L (ref 1.15–1.40)
HCT: 24 % — ABNORMAL LOW (ref 36.0–46.0)
Hemoglobin: 8.2 g/dL — ABNORMAL LOW (ref 12.0–15.0)
O2 Saturation: 40 %
Patient temperature: 36.5
Potassium: 4.3 mmol/L (ref 3.5–5.1)
Sodium: 140 mmol/L (ref 135–145)
TCO2: 27 mmol/L (ref 22–32)
pCO2, Ven: 45.7 mmHg (ref 44.0–60.0)
pH, Ven: 7.347 (ref 7.250–7.430)
pO2, Ven: 24 mmHg — CL (ref 32.0–45.0)

## 2020-07-25 SURGERY — POSTERIOR LUMBAR FUSION 2 LEVEL
Anesthesia: General | Site: Spine Lumbar

## 2020-07-25 MED ORDER — ESCITALOPRAM OXALATE 20 MG PO TABS
20.0000 mg | ORAL_TABLET | Freq: Every day | ORAL | Status: DC
  Administered 2020-07-26 – 2020-07-27 (×2): 20 mg via ORAL
  Filled 2020-07-25 (×2): qty 1

## 2020-07-25 MED ORDER — SENNA 8.6 MG PO TABS
1.0000 | ORAL_TABLET | Freq: Two times a day (BID) | ORAL | Status: DC
  Administered 2020-07-25 – 2020-07-27 (×5): 8.6 mg via ORAL
  Filled 2020-07-25 (×5): qty 1

## 2020-07-25 MED ORDER — METHOCARBAMOL 500 MG PO TABS
ORAL_TABLET | ORAL | Status: AC
  Filled 2020-07-25: qty 1

## 2020-07-25 MED ORDER — ROCURONIUM BROMIDE 10 MG/ML (PF) SYRINGE
PREFILLED_SYRINGE | INTRAVENOUS | Status: AC
  Filled 2020-07-25: qty 20

## 2020-07-25 MED ORDER — PHENOL 1.4 % MT LIQD: 1.0000 | OROMUCOSAL | Status: DC | PRN

## 2020-07-25 MED ORDER — OXYCODONE-ACETAMINOPHEN 5-325 MG PO TABS
1.0000 | ORAL_TABLET | ORAL | Status: DC | PRN
  Administered 2020-07-25 – 2020-07-27 (×11): 2 via ORAL
  Filled 2020-07-25 (×11): qty 2

## 2020-07-25 MED ORDER — CEFAZOLIN SODIUM 1 G IJ SOLR
INTRAMUSCULAR | Status: AC
  Filled 2020-07-25: qty 20

## 2020-07-25 MED ORDER — CEFAZOLIN SODIUM-DEXTROSE 2-4 GM/100ML-% IV SOLN
2.0000 g | Freq: Three times a day (TID) | INTRAVENOUS | Status: AC
  Administered 2020-07-25 – 2020-07-26 (×2): 2 g via INTRAVENOUS
  Filled 2020-07-25 (×2): qty 100

## 2020-07-25 MED ORDER — ALBUMIN HUMAN 5 % IV SOLN: INTRAVENOUS | Status: DC | PRN

## 2020-07-25 MED ORDER — LACTATED RINGERS IV SOLN: INTRAVENOUS | Status: DC | PRN

## 2020-07-25 MED ORDER — MIDAZOLAM HCL 5 MG/5ML IJ SOLN
INTRAMUSCULAR | Status: DC | PRN
  Administered 2020-07-25: 2 mg via INTRAVENOUS

## 2020-07-25 MED ORDER — METOPROLOL SUCCINATE ER 50 MG PO TB24
50.0000 mg | ORAL_TABLET | Freq: Every day | ORAL | Status: DC
  Administered 2020-07-25 – 2020-07-26 (×2): 50 mg via ORAL
  Filled 2020-07-25 (×2): qty 1

## 2020-07-25 MED ORDER — MENTHOL 3 MG MT LOZG: 1.0000 | LOZENGE | OROMUCOSAL | Status: DC | PRN

## 2020-07-25 MED ORDER — PROPOFOL 10 MG/ML IV BOLUS
INTRAVENOUS | Status: AC
  Filled 2020-07-25: qty 20

## 2020-07-25 MED ORDER — DEXAMETHASONE SODIUM PHOSPHATE 10 MG/ML IJ SOLN
INTRAMUSCULAR | Status: AC
  Filled 2020-07-25: qty 1

## 2020-07-25 MED ORDER — LIDOCAINE-EPINEPHRINE 1 %-1:100000 IJ SOLN
INTRAMUSCULAR | Status: AC
  Filled 2020-07-25: qty 1

## 2020-07-25 MED ORDER — THROMBIN 5000 UNITS EX SOLR
CUTANEOUS | Status: AC
  Filled 2020-07-25: qty 5000

## 2020-07-25 MED ORDER — PHENYLEPHRINE 40 MCG/ML (10ML) SYRINGE FOR IV PUSH (FOR BLOOD PRESSURE SUPPORT)
PREFILLED_SYRINGE | INTRAVENOUS | Status: DC | PRN
  Administered 2020-07-25: 120 ug via INTRAVENOUS

## 2020-07-25 MED ORDER — ACETAMINOPHEN 500 MG PO TABS
ORAL_TABLET | ORAL | Status: AC
  Filled 2020-07-25: qty 2

## 2020-07-25 MED ORDER — SODIUM CHLORIDE 0.9% FLUSH: 3.0000 mL | INTRAVENOUS | Status: DC | PRN

## 2020-07-25 MED ORDER — ONDANSETRON HCL 4 MG/2ML IJ SOLN: 4.0000 mg | Freq: Four times a day (QID) | INTRAMUSCULAR | Status: DC | PRN

## 2020-07-25 MED ORDER — ACETAMINOPHEN 325 MG PO TABS
ORAL_TABLET | ORAL | Status: AC
  Filled 2020-07-25: qty 2

## 2020-07-25 MED ORDER — LIDOCAINE HCL (PF) 2 % IJ SOLN
INTRAMUSCULAR | Status: AC
  Filled 2020-07-25: qty 5

## 2020-07-25 MED ORDER — THROMBIN 20000 UNITS EX SOLR: CUTANEOUS | Status: DC | PRN

## 2020-07-25 MED ORDER — LIDOCAINE-EPINEPHRINE 1 %-1:100000 IJ SOLN
INTRAMUSCULAR | Status: DC | PRN
  Administered 2020-07-25: 5 mL

## 2020-07-25 MED ORDER — BUPIVACAINE HCL (PF) 0.5 % IJ SOLN
INTRAMUSCULAR | Status: DC | PRN
  Administered 2020-07-25: 5 mL

## 2020-07-25 MED ORDER — 0.9 % SODIUM CHLORIDE (POUR BTL) OPTIME
TOPICAL | Status: DC | PRN
  Administered 2020-07-25: 1000 mL

## 2020-07-25 MED ORDER — OXYCODONE HCL 5 MG PO TABS
5.0000 mg | ORAL_TABLET | Freq: Once | ORAL | Status: AC | PRN
  Administered 2020-07-25: 5 mg via ORAL

## 2020-07-25 MED ORDER — MIDAZOLAM HCL 2 MG/2ML IJ SOLN
INTRAMUSCULAR | Status: AC
  Filled 2020-07-25: qty 2

## 2020-07-25 MED ORDER — THROMBIN 20000 UNITS EX SOLR
CUTANEOUS | Status: AC
  Filled 2020-07-25: qty 20000

## 2020-07-25 MED ORDER — METHOCARBAMOL 1000 MG/10ML IJ SOLN
500.0000 mg | Freq: Four times a day (QID) | INTRAVENOUS | Status: DC | PRN
  Filled 2020-07-25: qty 5

## 2020-07-25 MED ORDER — ACETAMINOPHEN 160 MG/5ML PO SOLN: 1000.0000 mg | Freq: Once | ORAL | Status: AC | PRN

## 2020-07-25 MED ORDER — ONDANSETRON HCL 4 MG PO TABS: 4.0000 mg | ORAL_TABLET | Freq: Four times a day (QID) | ORAL | Status: DC | PRN

## 2020-07-25 MED ORDER — ACETAMINOPHEN 10 MG/ML IV SOLN: 1000.0000 mg | Freq: Once | INTRAVENOUS | Status: DC | PRN

## 2020-07-25 MED ORDER — OXYCODONE HCL 5 MG PO TABS
ORAL_TABLET | ORAL | Status: AC
  Filled 2020-07-25: qty 1

## 2020-07-25 MED ORDER — MORPHINE SULFATE (PF) 2 MG/ML IV SOLN: 2.0000 mg | INTRAVENOUS | Status: DC | PRN

## 2020-07-25 MED ORDER — CEFAZOLIN SODIUM-DEXTROSE 2-4 GM/100ML-% IV SOLN
2.0000 g | INTRAVENOUS | Status: AC
  Administered 2020-07-25 (×2): 2 g via INTRAVENOUS
  Filled 2020-07-25: qty 100

## 2020-07-25 MED ORDER — PHENYLEPHRINE 40 MCG/ML (10ML) SYRINGE FOR IV PUSH (FOR BLOOD PRESSURE SUPPORT)
PREFILLED_SYRINGE | INTRAVENOUS | Status: AC
  Filled 2020-07-25: qty 10

## 2020-07-25 MED ORDER — THROMBIN 5000 UNITS EX SOLR: OROMUCOSAL | Status: DC | PRN

## 2020-07-25 MED ORDER — SUGAMMADEX SODIUM 200 MG/2ML IV SOLN
INTRAVENOUS | Status: DC | PRN
  Administered 2020-07-25: 200 mg via INTRAVENOUS

## 2020-07-25 MED ORDER — LIDOCAINE 2% (20 MG/ML) 5 ML SYRINGE
INTRAMUSCULAR | Status: DC | PRN
  Administered 2020-07-25: 60 mg via INTRAVENOUS

## 2020-07-25 MED ORDER — FENTANYL CITRATE (PF) 250 MCG/5ML IJ SOLN
INTRAMUSCULAR | Status: AC
  Filled 2020-07-25: qty 5

## 2020-07-25 MED ORDER — FENTANYL CITRATE (PF) 100 MCG/2ML IJ SOLN
INTRAMUSCULAR | Status: AC
  Filled 2020-07-25: qty 2

## 2020-07-25 MED ORDER — DEXAMETHASONE SODIUM PHOSPHATE 10 MG/ML IJ SOLN
INTRAMUSCULAR | Status: DC | PRN
  Administered 2020-07-25: 10 mg via INTRAVENOUS

## 2020-07-25 MED ORDER — ORAL CARE MOUTH RINSE: 15.0000 mL | Freq: Once | OROMUCOSAL | Status: AC

## 2020-07-25 MED ORDER — POLYETHYLENE GLYCOL 3350 17 G PO PACK: 17.0000 g | PACK | Freq: Every day | ORAL | Status: DC | PRN

## 2020-07-25 MED ORDER — METHOCARBAMOL 500 MG PO TABS
500.0000 mg | ORAL_TABLET | Freq: Four times a day (QID) | ORAL | Status: DC | PRN
  Administered 2020-07-25 – 2020-07-27 (×7): 500 mg via ORAL
  Filled 2020-07-25 (×7): qty 1

## 2020-07-25 MED ORDER — FLEET ENEMA 7-19 GM/118ML RE ENEM: 1.0000 | ENEMA | Freq: Once | RECTAL | Status: DC | PRN

## 2020-07-25 MED ORDER — CHLORHEXIDINE GLUCONATE CLOTH 2 % EX PADS: 6.0000 | MEDICATED_PAD | Freq: Once | CUTANEOUS | Status: DC

## 2020-07-25 MED ORDER — ALBUTEROL SULFATE HFA 108 (90 BASE) MCG/ACT IN AERS: 2.0000 | INHALATION_SPRAY | Freq: Four times a day (QID) | RESPIRATORY_TRACT | Status: DC | PRN

## 2020-07-25 MED ORDER — HYDROCHLOROTHIAZIDE 25 MG PO TABS
25.0000 mg | ORAL_TABLET | Freq: Every day | ORAL | Status: DC
  Administered 2020-07-26 – 2020-07-27 (×2): 25 mg via ORAL
  Filled 2020-07-25 (×2): qty 1

## 2020-07-25 MED ORDER — ONDANSETRON HCL 4 MG/2ML IJ SOLN
INTRAMUSCULAR | Status: DC | PRN
  Administered 2020-07-25: 4 mg via INTRAVENOUS

## 2020-07-25 MED ORDER — PHENYLEPHRINE HCL-NACL 10-0.9 MG/250ML-% IV SOLN
INTRAVENOUS | Status: DC | PRN
  Administered 2020-07-25: 25 ug/min via INTRAVENOUS

## 2020-07-25 MED ORDER — BUPIVACAINE HCL (PF) 0.5 % IJ SOLN
INTRAMUSCULAR | Status: AC
  Filled 2020-07-25: qty 30

## 2020-07-25 MED ORDER — FENTANYL CITRATE (PF) 250 MCG/5ML IJ SOLN
INTRAMUSCULAR | Status: DC | PRN
  Administered 2020-07-25: 75 ug via INTRAVENOUS
  Administered 2020-07-25 (×3): 50 ug via INTRAVENOUS
  Administered 2020-07-25: 150 ug via INTRAVENOUS
  Administered 2020-07-25: 25 ug via INTRAVENOUS

## 2020-07-25 MED ORDER — LACTATED RINGERS IV SOLN: INTRAVENOUS | Status: DC

## 2020-07-25 MED ORDER — SODIUM CHLORIDE 0.9% FLUSH
3.0000 mL | Freq: Two times a day (BID) | INTRAVENOUS | Status: DC
  Administered 2020-07-25 – 2020-07-26 (×3): 3 mL via INTRAVENOUS

## 2020-07-25 MED ORDER — SODIUM CHLORIDE (PF) 0.9 % IJ SOLN
INTRAMUSCULAR | Status: AC
  Filled 2020-07-25: qty 10

## 2020-07-25 MED ORDER — DOCUSATE SODIUM 100 MG PO CAPS
100.0000 mg | ORAL_CAPSULE | Freq: Two times a day (BID) | ORAL | Status: DC
  Administered 2020-07-25 – 2020-07-27 (×5): 100 mg via ORAL
  Filled 2020-07-25 (×5): qty 1

## 2020-07-25 MED ORDER — ONDANSETRON HCL 4 MG/2ML IJ SOLN
INTRAMUSCULAR | Status: AC
  Filled 2020-07-25: qty 2

## 2020-07-25 MED ORDER — BISACODYL 10 MG RE SUPP: 10.0000 mg | Freq: Every day | RECTAL | Status: DC | PRN

## 2020-07-25 MED ORDER — OXYCODONE HCL 5 MG/5ML PO SOLN: 5.0000 mg | Freq: Once | ORAL | Status: AC | PRN

## 2020-07-25 MED ORDER — ROCURONIUM BROMIDE 10 MG/ML (PF) SYRINGE
PREFILLED_SYRINGE | INTRAVENOUS | Status: DC | PRN
  Administered 2020-07-25: 100 mg via INTRAVENOUS
  Administered 2020-07-25: 40 mg via INTRAVENOUS
  Administered 2020-07-25: 30 mg via INTRAVENOUS

## 2020-07-25 MED ORDER — CHLORHEXIDINE GLUCONATE 0.12 % MT SOLN
15.0000 mL | Freq: Once | OROMUCOSAL | Status: AC
  Administered 2020-07-25: 15 mL via OROMUCOSAL
  Filled 2020-07-25: qty 15

## 2020-07-25 MED ORDER — ACETAMINOPHEN 650 MG RE SUPP: 650.0000 mg | RECTAL | Status: DC | PRN

## 2020-07-25 MED ORDER — ACETAMINOPHEN 325 MG PO TABS: 650.0000 mg | ORAL_TABLET | ORAL | Status: DC | PRN

## 2020-07-25 MED ORDER — PROPOFOL 10 MG/ML IV BOLUS
INTRAVENOUS | Status: DC | PRN
  Administered 2020-07-25: 140 mg via INTRAVENOUS

## 2020-07-25 MED ORDER — ACETAMINOPHEN 500 MG PO TABS
1000.0000 mg | ORAL_TABLET | Freq: Once | ORAL | Status: AC | PRN
  Administered 2020-07-25: 1000 mg via ORAL

## 2020-07-25 MED ORDER — FENTANYL CITRATE (PF) 100 MCG/2ML IJ SOLN
25.0000 ug | INTRAMUSCULAR | Status: DC | PRN
  Administered 2020-07-25 (×3): 50 ug via INTRAVENOUS

## 2020-07-25 SURGICAL SUPPLY — 67 items
ADH SKN CLS APL DERMABOND .7 (GAUZE/BANDAGES/DRESSINGS) ×1
APL SRG 60D 8 XTD TIP BNDBL (TIP)
BASKET BONE COLLECTION (BASKET) ×3 IMPLANT
BONE CANC CHIPS 40CC CAN1/2 (Bone Implant) ×3 IMPLANT
BUR MATCHSTICK NEURO 3.0 LAGG (BURR) ×3 IMPLANT
CANISTER SUCT 3000ML PPV (MISCELLANEOUS) ×3 IMPLANT
CHIPS CANC BONE 40CC CAN1/2 (Bone Implant) ×1 IMPLANT
CNTNR URN SCR LID CUP LEK RST (MISCELLANEOUS) ×1 IMPLANT
CONT SPEC 4OZ STRL OR WHT (MISCELLANEOUS) ×3
COVER BACK TABLE 60X90IN (DRAPES) ×3 IMPLANT
DERMABOND ADVANCED (GAUZE/BANDAGES/DRESSINGS) ×2
DERMABOND ADVANCED .7 DNX12 (GAUZE/BANDAGES/DRESSINGS) ×1 IMPLANT
DEVICE DISSECT PLASMABLAD 3.0S (MISCELLANEOUS) ×1 IMPLANT
DRAPE C-ARM 42X72 X-RAY (DRAPES) ×6 IMPLANT
DRAPE C-ARMOR (DRAPES) ×2 IMPLANT
DRAPE LAPAROTOMY 100X72X124 (DRAPES) ×3 IMPLANT
DRSG OPSITE POSTOP 4X8 (GAUZE/BANDAGES/DRESSINGS) ×2 IMPLANT
DURAPREP 26ML APPLICATOR (WOUND CARE) ×3 IMPLANT
DURASEAL APPLICATOR TIP (TIP) IMPLANT
DURASEAL SPINE SEALANT 3ML (MISCELLANEOUS) IMPLANT
ELECT REM PT RETURN 9FT ADLT (ELECTROSURGICAL) ×3
ELECTRODE REM PT RTRN 9FT ADLT (ELECTROSURGICAL) ×1 IMPLANT
GLOVE BIOGEL PI IND STRL 6.5 (GLOVE) IMPLANT
GLOVE BIOGEL PI IND STRL 7.5 (GLOVE) IMPLANT
GLOVE BIOGEL PI IND STRL 8.5 (GLOVE) ×2 IMPLANT
GLOVE BIOGEL PI INDICATOR 6.5 (GLOVE) ×6
GLOVE BIOGEL PI INDICATOR 7.5 (GLOVE) ×2
GLOVE BIOGEL PI INDICATOR 8.5 (GLOVE) ×4
GLOVE ECLIPSE 8.5 STRL (GLOVE) ×6 IMPLANT
GLOVE SS N UNI LF 7.0 STRL (GLOVE) ×8 IMPLANT
GOWN STRL REUS W/ TWL LRG LVL3 (GOWN DISPOSABLE) IMPLANT
GOWN STRL REUS W/ TWL XL LVL3 (GOWN DISPOSABLE) IMPLANT
GOWN STRL REUS W/TWL 2XL LVL3 (GOWN DISPOSABLE) ×6 IMPLANT
GOWN STRL REUS W/TWL LRG LVL3 (GOWN DISPOSABLE) ×3
GOWN STRL REUS W/TWL XL LVL3 (GOWN DISPOSABLE) ×3
GRAFT BNE CHIP CANC 1-8 40 (Bone Implant) IMPLANT
HEMOSTAT POWDER KIT SURGIFOAM (HEMOSTASIS) ×2 IMPLANT
IMPL TLX20 8X11X26 20D (Cage) IMPLANT
IMPL TLX20 9X11X26 20D (Cage) IMPLANT
KIT BASIN OR (CUSTOM PROCEDURE TRAY) ×3 IMPLANT
KIT TURNOVER KIT B (KITS) ×3 IMPLANT
MILL MEDIUM DISP (BLADE) ×3 IMPLANT
NDL SPNL 18GX3.5 QUINCKE PK (NEEDLE) IMPLANT
NEEDLE HYPO 22GX1.5 SAFETY (NEEDLE) ×3 IMPLANT
NEEDLE SPNL 18GX3.5 QUINCKE PK (NEEDLE) ×3 IMPLANT
NS IRRIG 1000ML POUR BTL (IV SOLUTION) ×3 IMPLANT
PACK LAMINECTOMY NEURO (CUSTOM PROCEDURE TRAY) ×3 IMPLANT
PATTIES SURGICAL .5 X1 (DISPOSABLE) ×3 IMPLANT
PLASMABLADE 3.0S (MISCELLANEOUS) ×3
ROD RELINE LORDOTIC 5.5X45 (Rod) ×4 IMPLANT
SCREW LOCK RELINE 5.5 TULIP (Screw) ×12 IMPLANT
SCREW RELINE-O POLY 6.5X40 (Screw) ×2 IMPLANT
SCREW RELINE-O POLY 6.5X45 (Screw) ×10 IMPLANT
SPONGE SURGIFOAM ABS GEL 100 (HEMOSTASIS) ×3 IMPLANT
SUT PROLENE 6 0 BV (SUTURE) IMPLANT
SUT VIC AB 1 CT1 18XBRD ANBCTR (SUTURE) ×1 IMPLANT
SUT VIC AB 1 CT1 8-18 (SUTURE) ×3
SUT VIC AB 2-0 CP2 18 (SUTURE) ×3 IMPLANT
SUT VIC AB 3-0 SH 8-18 (SUTURE) ×3 IMPLANT
SUT VIC AB 4-0 RB1 18 (SUTURE) ×3 IMPLANT
SYR 3ML LL SCALE MARK (SYRINGE) ×12 IMPLANT
TLX20 IMPLANT 8X11X26 20D (Cage) ×3 IMPLANT
TLX20 IMPLANT 9X11X26 20D (Cage) ×3 IMPLANT
TOWEL GREEN STERILE (TOWEL DISPOSABLE) ×3 IMPLANT
TOWEL GREEN STERILE FF (TOWEL DISPOSABLE) ×3 IMPLANT
TRAY FOLEY MTR SLVR 16FR STAT (SET/KITS/TRAYS/PACK) ×3 IMPLANT
WATER STERILE IRR 1000ML POUR (IV SOLUTION) ×3 IMPLANT

## 2020-07-25 NOTE — Transfer of Care (Signed)
Immediate Anesthesia Transfer of Care Note  Patient: Teresa Navarro  Procedure(s) Performed: Lumbar Four-Five Lumbar Five-Sacral One Posterior Lumbar Interbody Fusion (N/A Spine Lumbar)  Patient Location: PACU  Anesthesia Type:General  Level of Consciousness: awake, alert  and oriented  Airway & Oxygen Therapy: Patient Spontanous Breathing and Patient connected to nasal cannula oxygen  Post-op Assessment: Report given to RN, Post -op Vital signs reviewed and stable and Patient moving all extremities X 4  Post vital signs: Reviewed and stable  Last Vitals:  Vitals Value Taken Time  BP 119/65 07/25/20 1303  Temp    Pulse 84 07/25/20 1309  Resp 4 07/25/20 1309  SpO2 100 % 07/25/20 1309  Vitals shown include unvalidated device data.  Last Pain:  Vitals:   07/25/20 0641  TempSrc:   PainSc: 4       Patients Stated Pain Goal: 3 (07/25/20 0641)  Complications: No complications documented.

## 2020-07-25 NOTE — Op Note (Signed)
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  Posterior lumbar interbody arthrodesis L4-5 and L5-S1 using TL X expandable spacer measuring 8 x 11 x 26 mm with 15 degrees of lordosis at L5-S1 and 9 x 11 x 26 mm with 20 degrees lordosis at L4-5 and her body arthrodesis with local autograft and allograft pedicle fixation L4 to sacrum. Surgeon: Barnett Abu First Assistant: Donalee Citrin MD Anesthesia: General endotracheal Indications: Teresa Navarro is a 39 year old individual whose had significant back and bilateral lower extremity pain with weakness she has evidence of a spondylolisthesis at L5-S1 which has reached a grade 3 state she is tried all manner of conservative therapy but despite this is having increasing pain and weakness and having activities of daily living becoming increasingly challenging she has been advised regarding the need for surgery to decompress and stabilize the L4-5 and L5-S1 levels as she has some early degenerative changes at L4-5 with a retrolisthesis of L4 on L5.  Procedure: Patient was brought to the operating room supine on the stretcher.  After the smooth induction of general endotracheal anesthesia, she was carefully turned prone.  The back was prepped with alcohol DuraPrep and draped in a sterile fashion.  A midline incision was created and carried down to the lumbodorsal fascia which was opened on either side of midline to expose the spinous processes of L4 and L5 and the sacrum.  There is noted L5 that the patient had a spina bifida occulta in addition to the the static segment that was easily movable.  This was then carefully dissected out and removed in an en bloc piece.  The area of L5-S1 was then exposed and  the S1 nerve root was identified and protected medially as it was decompressed and the L5 nerve root was identified superiorly being squeezed significantly between redundant ligamentous and disc material in the foramen proximally at the takeoff of the L5 nerve root.  This was carefully decompressed bilaterally and then the disc space was isolated and entered with a #15 blade a combination of curettes and rongeurs was used to created total discectomy at the L5-S1 level.  The endplates were then rongeured using a toothed curette and removal of all the endplate material was performed both medially and laterally.  The interspace was then sized with a distracting tool which allowed for placement of an 8 mm tall spacer it was decided to use a singular TLIF spacer in the midline that was expandable and this was expanded approximately 75% giving 15 degrees of lordosis.  The graft site was packed with a total of 3 cc of graft and then an additional 6 cc of graft was packed into the interspace.  Attention was then turned to L4-5 where a similar process was carried out however here and 9 x 11 x 26 mm spacer with 20 degrees of lordosis was placed into the interspace.  An additional 9 cc of bone graft was packed into the interspace in addition to 3 cc which was packed into the spacer itself.  Lateral gutters were then carefully decorticated and the transverse processes of L4-L5 and S1 were decorticated with the ala of the sacrum being exposed fully then an additional 10 cc of bone graft material was packed into each of these lateral gutters from L4 to the sacrum.  Pedicle entry sites were then chosen at L4-L5 and S1 and 6.5 x 45 mm screws were placed into each of these pedicles.  This was done with radiographic control and when verified the screws were set to allow for a more normal lordotic curvature with fill little bit of reduction of the L5-S1 spondylolisthesis to a grade 2 state.  The pedicle screws were connected together  with precontoured 45 mm rods.  These were tightened in a neutral construct.  Final tightening was then performed the lateral gutters were checked to be at make sure they are adequately packed with bone and then the nerve roots were checked to make sure that there were adequately decompressed at L4-L5 and S1 and the common dural tube was well decompressed once hemostasis was well-established the retractors were removed a total of 25 cc of half percent Marcaine was injected into the paraspinous muscle and fascia and then the lumbodorsal fascia was closed with #1 Vicryl in interrupted fashion 2-0 Vicryl was used in the subcutaneous tissues 3-0 Vicryl subcuticularly and the final 4-0 subcuticular closure was performed blood loss was estimated 200 cc for the entire procedure patient tolerated procedure well is returned to recovery room stable condition.

## 2020-07-25 NOTE — Progress Notes (Signed)
Patient denies heavy vaginal bleeding and states that she has just been spotting since Friday.

## 2020-07-25 NOTE — Anesthesia Procedure Notes (Signed)
Procedure Name: Intubation Date/Time: 07/25/2020 7:45 AM Performed by: Kyung Rudd, CRNA Pre-anesthesia Checklist: Patient identified, Emergency Drugs available, Suction available and Patient being monitored Patient Re-evaluated:Patient Re-evaluated prior to induction Oxygen Delivery Method: Circle system utilized Preoxygenation: Pre-oxygenation with 100% oxygen Induction Type: IV induction Ventilation: Mask ventilation without difficulty Laryngoscope Size: Mac and 3 Grade View: Grade I Tube type: Oral Tube size: 7.0 mm Number of attempts: 1 Airway Equipment and Method: Stylet Placement Confirmation: ETT inserted through vocal cords under direct vision,  positive ETCO2 and breath sounds checked- equal and bilateral Secured at: 21 cm Tube secured with: Tape Dental Injury: Teeth and Oropharynx as per pre-operative assessment

## 2020-07-25 NOTE — H&P (Signed)
Teresa Navarro is an 39 y.o. female.   Chief Complaint: Back and bilateral lower extremity pain, spondylolisthesis HPI: Teresa Navarro is a 39 year old individual who has a spondylolisthesis at L4-5 and L5-S1.  She has a high-grade stenosis for the L5 nerve roots and has been experiencing some weakness in her lower extremities.  Her motor function has been deteriorating recently and despite efforts at conservative management she has had progressive difficulty with her gait and sensation in her lower extremities.  She is now admitted to undergo surgical decompression and stabilization from L4 to the sacrum.  Past Medical History:  Diagnosis Date  . Anemia   . Anxiety   . Depression   . Hypertension    untrerated  . Vitamin D deficiency     Past Surgical History:  Procedure Laterality Date  . MYRINGOTOMY    . TONSILLECTOMY      Family History  Problem Relation Age of Onset  . Diabetes Mother   . Hypertension Father    Social History:  reports that she has never smoked. She has never used smokeless tobacco. She reports that she does not drink alcohol and does not use drugs.  Allergies: No Known Allergies  Medications Prior to Admission  Medication Sig Dispense Refill  . escitalopram (LEXAPRO) 20 MG tablet Take 20 mg by mouth daily.    . hydrochlorothiazide (HYDRODIURIL) 25 MG tablet Take 25 mg by mouth daily as needed for fluid.    . metoprolol succinate (TOPROL-XL) 50 MG 24 hr tablet Take 50 mg by mouth at bedtime.    . Vitamin D, Ergocalciferol, (DRISDOL) 1.25 MG (50000 UNIT) CAPS capsule Take 50,000 Units by mouth once a week.    Marland Kitchen albuterol (VENTOLIN HFA) 108 (90 Base) MCG/ACT inhaler Inhale 2 puffs into the lungs every 6 (six) hours as needed for wheezing or shortness of breath.     Marland Kitchen amoxicillin (AMOXIL) 875 MG tablet Take 1 tablet (875 mg total) by mouth 2 (two) times daily. (Rx void after 01/22/13) (Patient not taking: Reported on 07/13/2020) 14 tablet 0  . benzonatate  (TESSALON) 200 MG capsule Take 1 capsule (200 mg total) by mouth at bedtime. (Patient not taking: Reported on 07/13/2020) 12 capsule 0  . ondansetron (ZOFRAN ODT) 4 MG disintegrating tablet Take 1 tablet (4 mg total) by mouth every 8 (eight) hours as needed for nausea or vomiting. (Patient not taking: Reported on 07/13/2020) 20 tablet 0  . predniSONE (DELTASONE) 20 MG tablet Take 1 tablet (20 mg total) by mouth 2 (two) times daily. Take with food. (Patient not taking: Reported on 07/13/2020) 10 tablet 0    No results found for this or any previous visit (from the past 48 hour(s)). No results found.  Review of Systems  Constitutional: Positive for activity change.  HENT: Negative.   Eyes: Negative.   Respiratory: Negative.   Cardiovascular: Negative.   Gastrointestinal: Negative.   Endocrine: Negative.   Genitourinary: Negative.   Musculoskeletal: Positive for back pain and gait problem.  Allergic/Immunologic: Negative.   Neurological: Positive for weakness and numbness.  Hematological: Negative.   Psychiatric/Behavioral: Negative.     Blood pressure 129/67, pulse 82, temperature 98.3 F (36.8 C), temperature source Oral, resp. rate 18, height 5\' 3"  (1.6 m), weight 106.5 kg, SpO2 100 %. Physical Exam Constitutional:      Appearance: Normal appearance. She is obese.  HENT:     Head: Normocephalic and atraumatic.     Right Ear: Tympanic membrane normal.  Left Ear: Tympanic membrane normal.     Nose: Nose normal.     Mouth/Throat:     Mouth: Mucous membranes are moist.  Eyes:     Extraocular Movements: Extraocular movements intact.  Cardiovascular:     Pulses: Normal pulses.     Heart sounds: Normal heart sounds.  Pulmonary:     Effort: Pulmonary effort is normal.     Breath sounds: Normal breath sounds.  Abdominal:     General: Abdomen is flat. Bowel sounds are normal.     Palpations: Abdomen is soft.  Musculoskeletal:        General: Normal range of motion.      Cervical back: Normal range of motion and neck supple.  Neurological:     Mental Status: She is alert.     Comments: Slight weakness in tibialis anterior 4 out of 5 bilaterally.  Tone and bulk are intact.  Gait is slightly wide-based.  Sensation is diminished on the dorsum of the feet on both sides to pin and light touch.  Cranial nerve examination is normal upper extremity strength and reflexes are normal.  Psychiatric:        Mood and Affect: Mood normal.        Behavior: Behavior normal.        Thought Content: Thought content normal.        Judgment: Judgment normal.      Assessment/Plan 1 low listhesis L4-5 L5-S1 stenosis, neurogenic claudication.  Lumbar radiculopathy.  Plan: Decompression fusion L4-5 and L5-S1.  Stefani Dama, MD 07/25/2020, 7:33 AM

## 2020-07-25 NOTE — Progress Notes (Signed)
Orthopedic Tech Progress Note Patient Details:  Teresa Navarro 04/02/1981 [id number] [facility] Called Patient ID: Teresa Navarro, female   DOB: Aug 07, 1981, 39 y.o.   MRN: [id number]   Teresa Navarro 07/25/2020, 2:29 PM

## 2020-07-26 LAB — CBC
HCT: 23.2 % — ABNORMAL LOW (ref 36.0–46.0)
Hemoglobin: 7.4 g/dL — ABNORMAL LOW (ref 12.0–15.0)
MCH: 31.5 pg (ref 26.0–34.0)
MCHC: 31.9 g/dL (ref 30.0–36.0)
MCV: 98.7 fL (ref 80.0–100.0)
Platelets: 268 10*3/uL (ref 150–400)
RBC: 2.35 MIL/uL — ABNORMAL LOW (ref 3.87–5.11)
RDW: 13.5 % (ref 11.5–15.5)
WBC: 17.6 10*3/uL — ABNORMAL HIGH (ref 4.0–10.5)
nRBC: 0.1 % (ref 0.0–0.2)

## 2020-07-26 LAB — BASIC METABOLIC PANEL
Anion gap: 11 (ref 5–15)
BUN: 12 mg/dL (ref 6–20)
CO2: 25 mmol/L (ref 22–32)
Calcium: 8.7 mg/dL — ABNORMAL LOW (ref 8.9–10.3)
Chloride: 103 mmol/L (ref 98–111)
Creatinine, Ser: 0.79 mg/dL (ref 0.44–1.00)
GFR, Estimated: >60 mL/min (ref >60)
Glucose, Bld: 102 mg/dL — ABNORMAL HIGH (ref 70–99)
Potassium: 4.1 mmol/L (ref 3.5–5.1)
Sodium: 139 mmol/L (ref 135–145)

## 2020-07-26 LAB — PREPARE RBC (CROSSMATCH)

## 2020-07-26 MED ORDER — SODIUM CHLORIDE 0.9% IV SOLUTION: Freq: Once | INTRAVENOUS | Status: DC

## 2020-07-26 NOTE — Evaluation (Signed)
Physical Therapy Evaluation Patient Details Name: Teresa Navarro MRN: [id number] DOB: [birth year] Today's Date: 07/26/2020   History of Present Illness   Pt is a 39 y/o female now s/p Bilateral laminectomy and decompression, PLIF of L4-5 and L5-S1 on 12/6. PMHx includes anxiety, depression, HTN  Clinical Impression  Pt admitted with above diagnosis. At the time of PT eval, pt was able to demonstrate transfers and ambulation with gross min guard assist progressing to supervision for safety with an AD. Pt was educated on precautions, brace application/wearing schedule, appropriate activity progression, and car transfer. Pt currently with functional limitations due to the deficits listed below (see PT Problem List). Pt will benefit from skilled PT to increase their independence and safety with mobility to allow discharge to the venue listed below.      Follow Up Recommendations No PT follow up;Supervision for mobility/OOB    Equipment Recommendations  3in1 (PT);Cane    Recommendations for Other Services       Precautions / Restrictions Precautions Precautions: Fall;Back Precaution Booklet Issued: Yes (comment) Precaution Comments: issued and reviewed Required Braces or Orthoses: Spinal Brace Spinal Brace: Lumbar corset;Applied in sitting position Restrictions Weight Bearing Restrictions: No      Mobility  Bed Mobility Overal bed mobility: Modified Independent             General bed mobility comments: HOB slightly elevated. Pt able to transition to EOB without assist.     Transfers Overall transfer level: Needs assistance Equipment used: None Transfers: Sit to/from Stand Sit to Stand: Min assist         General transfer comment: Assist for power-up to full stand from an elevated bed height. Pt utilized momentum.   Ambulation/Gait Ambulation/Gait assistance: Min guard;Supervision Gait Distance (Feet): 400 Feet Assistive device: None;Straight cane Gait  Pattern/deviations: Step-through pattern;Decreased stride length;Trunk flexed Gait velocity: Decreased Gait velocity interpretation: <1.31 ft/sec, indicative of household ambulator General Gait Details: VC's for improved posture. Initially without AD and then with SPC. With [facility], pt progressed to a supervision level for safety but continued to require a decreased gait speed for comfort.   Stairs            Wheelchair Mobility    Modified Rankin (Stroke Patients Only)       Balance Overall balance assessment: Needs assistance Sitting-balance support: Feet supported Sitting balance-Leahy Scale: Good     Standing balance support: No upper extremity supported;During functional activity Standing balance-Leahy Scale: Fair                               Pertinent Vitals/Pain Pain Assessment: Faces Faces Pain Scale: Hurts whole lot Pain Location: incisional; low back Pain Descriptors / Indicators: Operative site guarding;Discomfort;Sore Pain Intervention(s): Limited activity within patient's tolerance;Monitored during session;Repositioned    Home Living Family/patient expects to be discharged to:: Private residence Living Arrangements: Spouse/significant other;Children (3 y/o) Available Help at Discharge: Family;Available PRN/intermittently (mom and spouse, spouse works) Type of Home: House Home Access: Stairs to enter   Secretary/administrator of Steps: 1 (threshold) Home Layout: One level Home Equipment: None      Prior Function Level of Independence: Independent         Comments: works, 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, 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[employer])     Higher education careers adviser        Extremity/Trunk Assessment   Upper Extremity Assessment Upper Extremity Assessment: Defer to OT evaluation    Lower Extremity  Assessment Lower Extremity Assessment: Generalized weakness (consistent with pre-op diagnosis)    Cervical / Trunk Assessment Cervical / Trunk Assessment:  Other exceptions Cervical / Trunk Exceptions: s/p lumbar surgery  Communication   Communication: No difficulties  Cognition Arousal/Alertness: Awake/alert Behavior During Therapy: WFL for tasks assessed/performed Overall Cognitive Status: Within Functional Limits for tasks assessed                                        General Comments      Exercises     Assessment/Plan    PT Assessment Patient needs continued PT services  PT Problem List Decreased strength;Decreased activity tolerance;Decreased balance;Decreased mobility;Decreased knowledge of use of DME;Decreased safety awareness;Decreased knowledge of precautions;Pain       PT Treatment Interventions DME instruction;Gait training;Stair training;Functional mobility training;Therapeutic activities;Therapeutic exercise;Balance training;Neuromuscular re-education;Cognitive remediation;Patient/family education    PT Goals (Current goals can be found in the Care Plan section)  Acute Rehab PT Goals Patient Stated Goal: home when able PT Goal Formulation: With patient Time For Goal Achievement: 08/02/20 Potential to Achieve Goals: Good    Frequency Min 5X/week   Barriers to discharge        Co-evaluation               AM-PAC PT "6 Clicks" Mobility  Outcome Measure Help needed turning from your back to your side while in a flat bed without using bedrails?: None Help needed moving from lying on your back to sitting on the side of a flat bed without using bedrails?: None Help needed moving to and from a bed to a chair (including a wheelchair)?: A Little Help needed standing up from a chair using your arms (e.g., wheelchair or bedside chair)?: A Little Help needed to walk in hospital room?: None Help needed climbing 3-5 steps with a railing? : A Little 6 Click Score: 21    End of Session Equipment Utilized During Treatment: Gait belt;Back brace Activity Tolerance: Patient tolerated treatment  well Patient left: in chair;with call bell/phone within reach Nurse Communication: Mobility status PT Visit Diagnosis: Unsteadiness on feet (R26.81);Pain Pain - part of body:  (back/incision site)    Time: 8295-6213 PT Time Calculation (min) (ACUTE ONLY): 23 min   Charges:   PT Evaluation $PT Eval Low Complexity: 1 Low PT Treatments $Gait Training: 8-22 mins        Conni Slipper, PT, DPT Acute Rehabilitation Services Pager: [phone] Office: [phone]   Teresa Navarro 07/26/2020, 2:10 PM

## 2020-07-26 NOTE — Progress Notes (Signed)
Patient ID: Teresa Navarro, female   DOB: 1980/11/25, 39 y.o.   MRN: [id number] Vital signs are stable Motor function is good Patient feels weak and listless She does not appear to be a bit pale Her hemoglobin is 7.4 She is still having some vaginal bleeding We will transfuse her 2 units of packed red cells today Plan discharge for the morning

## 2020-07-26 NOTE — Evaluation (Signed)
Occupational Therapy Evaluation Patient Details Name: Teresa Navarro MRN: [id number] DOB: [birth year] Today's Date: 07/26/2020    History of Present Illness  Pt is a 39 y/o female now s/p Bilateral laminectomy and decompression, PLIF of L4-5 and L5-S1 on 12/6. PMHx includes anxiety, depression, HTN   Clinical Impression   This 39 y/o female presents with the above. PTA pt reports being independent with ADL and functional mobility. Today pt performing room level mobility without AD at [facility] assist level, requiring up to minA for ADL. Educated pt re: back precautions, brace management, safety and compensatory strategies for performing ADL and mobility tasks while maintaining precautions with pt verbalizing/return demonstrating understanding. Pt reports plans to return home with intermittent assist from spouse/mother. She will benefit from continued acute OT services to maximize her safety and independence with ADL and mobility tasks; do not anticipate she will require follow up OT services after discharge.     Follow Up Recommendations  No OT follow up;Supervision/Assistance - 24 hour (close to 24hr initially)    Equipment Recommendations  3 in 1 bedside commode           Precautions / Restrictions Precautions Precautions: Fall;Back Precaution Booklet Issued: Yes (comment) Precaution Comments: issued and reviewed Required Braces or Orthoses: Spinal Brace Spinal Brace: Lumbar corset;Applied in sitting position Restrictions Weight Bearing Restrictions: No      Mobility Bed Mobility               General bed mobility comments: pt seated EOB upon arrival, verbally reviewed log roll technique     Transfers Overall transfer level: Needs assistance Equipment used: None Transfers: Sit to/from Stand Sit to Stand: Min guard         General transfer comment: for balance and safety, no physical assist required     Balance Overall balance assessment: Needs  assistance Sitting-balance support: Feet supported Sitting balance-Leahy Scale: Good     Standing balance support: No upper extremity supported;During functional activity Standing balance-Leahy Scale: Fair                             ADL either performed or assessed with clinical judgement   ADL Overall ADL's : Needs assistance/impaired Eating/Feeding: Modified independent;Sitting   Grooming: Supervision/safety;Standing   Upper Body Bathing: Sitting;Modified independent   Lower Body Bathing: Min guard;Sit to/from stand   Upper Body Dressing : Modified independent;Sitting;Set up Upper Body Dressing Details (indicate cue type and reason): overhead shirt, setup for lumbar brace  Lower Body Dressing: Min guard;Sit to/from stand Lower Body Dressing Details (indicate cue type and reason): donning pants via figure 4, increased time/effort Toilet Transfer: Min guard;Ambulation Toilet Transfer Details (indicate cue type and reason): simulated via transfer to/from EOB Toileting- Clothing Manipulation and Hygiene: Sit to/from stand;Minimal assistance Toileting - Clothing Manipulation Details (indicate cue type and reason): educated in AE for increasing ease/independence wtih ADL task   Tub/Shower Transfer Details (indicate cue type and reason): verbally reviewed safe transfer techniques; educated in use of 3:1 as shower seat with pt verbalizing understanding  Functional mobility during ADLs: Min guard                           Pertinent Vitals/Pain Pain Assessment: Faces Faces Pain Scale: Hurts little more Pain Location: incisional Pain Descriptors / Indicators: Operative site guarding;Discomfort;Sore Pain Intervention(s): Limited activity within patient's tolerance;Monitored during session;Repositioned     Hand Dominance  Extremity/Trunk Assessment Upper Extremity Assessment Upper Extremity Assessment: Overall WFL for tasks assessed   Lower Extremity  Assessment Lower Extremity Assessment: Defer to PT evaluation   Cervical / Trunk Assessment Cervical / Trunk Assessment: Other exceptions Cervical / Trunk Exceptions: s/p lumbar surgery   Communication Communication Communication: No difficulties   Cognition Arousal/Alertness: Awake/alert Behavior During Therapy: WFL for tasks assessed/performed Overall Cognitive Status: Within Functional Limits for tasks assessed                                     General Comments       Exercises     Shoulder Instructions      Home Living Family/patient expects to be discharged to:: Private residence Living Arrangements: Spouse/significant other;Children (3 y/o) Available Help at Discharge: Family;Available PRN/intermittently (mom and spouse, spouse works) Type of Home: House Home Access: Stairs to enter [employer] of Steps: 1 (threshold)   Home Layout: One level     Bathroom Shower/Tub: Chief Strategy Officer: Standard     Home Equipment: None          Prior Functioning/Environment Level of Independence: Independent        Comments: works, 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, 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[employer])        OT Problem List: Decreased strength;Decreased range of motion;Impaired balance (sitting and/or standing);Decreased activity tolerance;Obesity;Pain;Decreased knowledge of precautions;Decreased knowledge of use of DME or AE      OT Treatment/Interventions: Self-care/ADL training;Therapeutic exercise;Energy conservation;DME and/or AE instruction;Therapeutic activities;Patient/family education;Balance training    OT Goals(Current goals can be found in the care plan section) Acute Rehab OT Goals Patient Stated Goal: home when able OT Goal Formulation: With patient Time For Goal Achievement: 08/09/20 Potential to Achieve Goals: Good  OT Frequency: Min 2X/week   Barriers to D/C:            Co-evaluation              AM-PAC OT "6  Clicks" Daily Activity     Outcome Measure Help from another person eating meals?: None Help from another person taking care of personal grooming?: A Little Help from another person toileting, which includes using toliet, bedpan, or urinal?: A Little Help from another person bathing (including washing, rinsing, drying)?: A Little Help from another person to put on and taking off regular upper body clothing?: A Little Help from another person to put on and taking off regular lower body clothing?: A Little 6 Click Score: 19   End of Session Equipment Utilized During Treatment: Back brace Nurse Communication: Mobility status  Activity Tolerance: Patient tolerated treatment well Patient left: with call bell/phone within reach;Other (comment) (seated EOB)  OT Visit Diagnosis: Other abnormalities of gait and mobility (R26.89);Pain Pain - part of body:  (back)                Time: 5102-5852 OT Time Calculation (min): 22 min Charges:  OT General Charges $OT Visit: 1 Visit OT Evaluation $OT Eval Low Complexity: 1 Low  Marcy Siren, OT Acute Rehabilitation Services Pager [phone] Office [phone]  Orlando Penner 07/26/2020, 9:35 AM

## 2020-07-27 LAB — BPAM RBC
Blood Product Expiration Date: 202201032359
Blood Product Expiration Date: 202201052359
ISSUE DATE / TIME: 202112071505
ISSUE DATE / TIME: 202112072030
Unit Type and Rh: 6200
Unit Type and Rh: 6200

## 2020-07-27 LAB — TYPE AND SCREEN
ABO/RH(D): A POS
ABO/RH(D): A POS
Antibody Screen: NEGATIVE
Antibody Screen: NEGATIVE
Unit division: 0
Unit division: 0

## 2020-07-27 LAB — CBC
HCT: 28.5 % — ABNORMAL LOW (ref 36.0–46.0)
Hemoglobin: 9.5 g/dL — ABNORMAL LOW (ref 12.0–15.0)
MCH: 30.6 pg (ref 26.0–34.0)
MCHC: 33.3 g/dL (ref 30.0–36.0)
MCV: 91.9 fL (ref 80.0–100.0)
Platelets: 222 10*3/uL (ref 150–400)
RBC: 3.1 MIL/uL — ABNORMAL LOW (ref 3.87–5.11)
RDW: 15.8 % — ABNORMAL HIGH (ref 11.5–15.5)
WBC: 11.2 10*3/uL — ABNORMAL HIGH (ref 4.0–10.5)
nRBC: 0 % (ref 0.0–0.2)

## 2020-07-27 MED ORDER — OXYCODONE-ACETAMINOPHEN 5-325 MG PO TABS
1.0000 | ORAL_TABLET | ORAL | 0 refills | Status: AC | PRN
Start: 2020-07-27 — End: ?

## 2020-07-27 MED ORDER — METHOCARBAMOL 500 MG PO TABS: 500.0000 mg | ORAL_TABLET | Freq: Four times a day (QID) | ORAL | 3 refills | Status: AC | PRN

## 2020-07-27 MED FILL — Heparin Sodium (Porcine) Inj 1000 Unit/ML: INTRAMUSCULAR | Qty: 30 | Status: AC

## 2020-07-27 MED FILL — Sodium Chloride IV Soln 0.9%: INTRAVENOUS | Qty: 1000 | Status: AC

## 2020-07-27 NOTE — Progress Notes (Signed)
Physical Therapy Treatment Patient Details Name: Teresa Navarro MRN: [id number] DOB: 23-Jan-1981 Today's Date: 07/27/2020    History of Present Illness  Pt is a 39 y/o female now s/p Bilateral laminectomy and decompression, PLIF of L4-5 and L5-S1 on 12/6. PMHx includes anxiety, depression, HTN    PT Comments    Pt progressing well with post-op mobility. She was able to demonstrate transfers and ambulation with gross min guard assist and RW for support. Pt reports the walker is more comfortable for her today, as she is having intermittent spasming of the hips (L worse than R reportedly). Pt was educated on precautions, brace application/wearing schedule, appropriate activity progression, and car transfer. Feel HHPT would be beneficial to progress her functional status at d/c. Will continue to follow.      Follow Up Recommendations  Home health PT;Supervision for mobility/OOB     Equipment Recommendations  3in1 (PT);Rolling walker with 5" wheels    Recommendations for Other Services       Precautions / Restrictions Precautions Precautions: Fall;Back Precaution Booklet Issued: Yes (comment) Precaution Comments: issued and reviewed Required Braces or Orthoses: Spinal Brace Spinal Brace: Lumbar corset;Applied in sitting position Restrictions Weight Bearing Restrictions: No    Mobility  Bed Mobility               General bed mobility comments: seated in chair  Transfers Overall transfer level: Needs assistance Equipment used: Rolling walker (2 wheeled) Transfers: Sit to/from Stand Sit to Stand: Min guard         General transfer comment: increased time/effort but no physical assist required. pt demonstrates safe hand placement   Ambulation/Gait Ambulation/Gait assistance: Supervision;Min guard Gait Distance (Feet): 200 Feet Assistive device: Rolling walker (2 wheeled) Gait Pattern/deviations: Step-through pattern;Decreased stride length;Trunk flexed Gait  velocity: Decreased Gait velocity interpretation: <1.31 ft/sec, indicative of household ambulator General Gait Details: Slow speed but pt taking very short, quick steps. When cued for improved step/stride length, pt reports cramping in her L hip. q   Stairs             Wheelchair Mobility    Modified Rankin (Stroke Patients Only)       Balance Overall balance assessment: Needs assistance Sitting-balance support: Feet supported Sitting balance-Leahy Scale: Good     Standing balance support: No upper extremity supported;During functional activity Standing balance-Leahy Scale: Poor Standing balance comment: Reliant on UE support                            Cognition Arousal/Alertness: Awake/alert Behavior During Therapy: WFL for tasks assessed/performed Overall Cognitive Status: Within Functional Limits for tasks assessed                                        Exercises      General Comments        Pertinent Vitals/Pain Pain Assessment: Faces Faces Pain Scale: Hurts whole lot Pain Location: hips L worse than R during spasms Pain Descriptors / Indicators: Operative site guarding;Discomfort;Tightness;Spasm Pain Intervention(s): Limited activity within patient's tolerance;Monitored during session;Repositioned    Home Living                      Prior Function            PT Goals (current goals can now be found in the care plan  section) Acute Rehab PT Goals Patient Stated Goal: home when able PT Goal Formulation: With patient Time For Goal Achievement: 08/02/20 Potential to Achieve Goals: Good Progress towards PT goals: Progressing toward goals    Frequency    Min 5X/week      PT Plan Discharge plan needs to be updated    Co-evaluation              AM-PAC PT "6 Clicks" Mobility   Outcome Measure  Help needed turning from your back to your side while in a flat bed without using bedrails?: None Help  needed moving from lying on your back to sitting on the side of a flat bed without using bedrails?: None Help needed moving to and from a bed to a chair (including a wheelchair)?: A Little Help needed standing up from a chair using your arms (e.g., wheelchair or bedside chair)?: A Little Help needed to walk in hospital room?: None Help needed climbing 3-5 steps with a railing? : A Little 6 Click Score: 21    End of Session Equipment Utilized During Treatment: Gait belt;Back brace Activity Tolerance: Patient tolerated treatment well Patient left: in chair;with call bell/phone within reach Nurse Communication: Mobility status PT Visit Diagnosis: Unsteadiness on feet (R26.81);Pain Pain - part of body:  (back/incision site)     Time: 0940-1004 PT Time Calculation (min) (ACUTE ONLY): 24 min  Charges:  $Gait Training: 23-37 mins                     Teresa Navarro, PT, DPT Acute Rehabilitation Services Pager: [phone] Office: [phone]    Teresa Navarro 07/27/2020, 2:11 PM

## 2020-07-27 NOTE — Discharge Summary (Signed)
Physician Discharge Summary  Patient ID: Teresa Navarro MRN: [id number] DOB/AGE: Dec 11, 1980 39 y.o.  Admit date: 07/25/2020 Discharge date: 07/27/2020  Admission Diagnoses: Lumbar spondylolisthesis grade 3 with neurogenic claudication lumbar radiculopathy.  L4-5 L5-S1  Discharge Diagnoses: Lumbar spondylolisthesis grade 3 with neurogenic claudication, lumbar radiculopathy. Active Problems:   Spondylolisthesis at L5-S1 level   Discharged Condition: good  Hospital Course: She was admitted to undergo surgical decompression stabilization from L4-5 and L5-S1.  Her initial hematocrit and hemoglobin was low secondary to recent vaginal bleeding but she tolerated surgery well.  She did require however 2 units of transfusion of blood after the surgery to raise her hemoglobin to the level of 9 which made her asymptomatic.  This was not acute blood loss anemia secondary to surgery though.  Patient is discharged home at this time.  Consults: None  Significant Diagnostic Studies: None  Treatments: surgery: Posterior decompression L4-5 and L5-S1 with interbody arthrodesis local autograft allograft fixation from L4 to sacrum posterior lateral arthrodesis with local autograft and allograft.  Discharge Exam: Blood pressure 127/71, pulse 73, temperature 98.8 F (37.1 C), temperature source Oral, resp. rate 16, height 5\' 3"  (1.6 m), weight 106.5 kg, SpO2 98 %. Incision is clean and dry motor function is intact.  Station and gait are intact  Disposition: Discharge disposition: 01-Home or Self Care       Discharge Instructions    Call MD for:  redness, tenderness, or signs of infection (pain, swelling, redness, odor or green/yellow discharge around incision site)   Complete by: As directed    Call MD for:  severe uncontrolled pain   Complete by: As directed    Call MD for:  temperature >100.4   Complete by: As directed    Diet - low sodium heart healthy   Complete by: As directed    Discharge  wound care:   Complete by: As directed    Okay to shower. Do not apply salves or appointments to incision. No heavy lifting with the upper extremities greater than 10 pounds. May resume driving when not requiring pain medication and patient feels comfortable with doing so.   Incentive spirometry RT   Complete by: As directed    Increase activity slowly   Complete by: As directed      Allergies as of 07/27/2020   No Known Allergies     Medication List    TAKE these medications   amoxicillin 875 MG tablet Commonly known as: AMOXIL Take 1 tablet (875 mg total) by mouth 2 (two) times daily. (Rx void after 01/22/13)   benzonatate 200 MG capsule Commonly known as: TESSALON Take 1 capsule (200 mg total) by mouth at bedtime.   escitalopram 20 MG tablet Commonly known as: LEXAPRO Take 20 mg by mouth daily.   hydrochlorothiazide 25 MG tablet Commonly known as: HYDRODIURIL Take 25 mg by mouth daily as needed for fluid.   methocarbamol 500 MG tablet Commonly known as: ROBAXIN Take 1 tablet (500 mg total) by mouth every 6 (six) hours as needed for muscle spasms.   metoprolol succinate 50 MG 24 hr tablet Commonly known as: TOPROL-XL Take 50 mg by mouth at bedtime.   ondansetron 4 MG disintegrating tablet Commonly known as: Zofran ODT Take 1 tablet (4 mg total) by mouth every 8 (eight) hours as needed for nausea or vomiting.   oxyCODONE-acetaminophen 5-325 MG tablet Commonly known as: PERCOCET/ROXICET Take 1-2 tablets by mouth every 3 (three) hours as needed for moderate pain or severe  pain.   predniSONE 20 MG tablet Commonly known as: DELTASONE Take 1 tablet (20 mg total) by mouth 2 (two) times daily. Take with food.   Ventolin HFA 108 (90 Base) MCG/ACT inhaler Generic drug: albuterol Inhale 2 puffs into the lungs every 6 (six) hours as needed for wheezing or shortness of breath.   Vitamin D (Ergocalciferol) 1.25 MG (50000 UNIT) Caps capsule Commonly known as: DRISDOL Take  50,000 Units by mouth once a week.            Discharge Care Instructions  (From admission, onward)         Start     Ordered   07/27/20 0000  Discharge wound care:       Comments: Okay to shower. Do not apply salves or appointments to incision. No heavy lifting with the upper extremities greater than 10 pounds. May resume driving when not requiring pain medication and patient feels comfortable with doing so.   07/27/20 0858           Signed: Shary Key Lianna Sitzmann 07/27/2020, 8:58 AM

## 2020-07-27 NOTE — Progress Notes (Signed)
Occupational Therapy Treatment Patient Details Name: Teresa Navarro MRN: [id number] DOB: [birth year] Today's Date: 07/27/2020    History of present illness  Pt is a 39 y/o female now s/p Bilateral laminectomy and decompression, PLIF of L4-5 and L5-S1 on 12/6. PMHx includes anxiety, depression, HTN   OT comments  Pt progressing towards OT goals. She presents seated EOB pleasant and willing to participate in therapy session. Pt utilizing RW for mobility tasks this session (per pt's preference), completing room level mobility and standing grooming ADL at supervision level throughout. Pt demonstrates good carryover of compensatory techniques for ADL tasks with further review/education provided to ensure maintaining back precautions. Pt reports she will likely d/c home today. Will continue per POC at this time.   Follow Up Recommendations  No OT follow up;Supervision/Assistance - 24 hour (close to 24hr initially)    Equipment Recommendations  3 in 1 bedside commode          Precautions / Restrictions Precautions Precautions: Fall;Back Precaution Booklet Issued: Yes (comment) Precaution Comments: issued and reviewed Required Braces or Orthoses: Spinal Brace Spinal Brace: Lumbar corset;Applied in sitting position Restrictions Weight Bearing Restrictions: No       Mobility Bed Mobility               General bed mobility comments: seated EOB  Transfers Overall transfer level: Needs assistance Equipment used: None;Rolling walker (2 wheeled) Transfers: Sit to/from Stand Sit to Stand: Min guard         General transfer comment: increased time/effort but now physical assist required. pt demonstrates safe hand placement     Balance Overall balance assessment: Needs assistance Sitting-balance support: Feet supported Sitting balance-Leahy Scale: Good     Standing balance support: No upper extremity supported;During functional activity Standing balance-Leahy Scale: Fair                              ADL either performed or assessed with clinical judgement   ADL Overall ADL's : Needs assistance/impaired     Grooming: Supervision/safety;Standing;Oral care;Wash/dry face Grooming Details (indicate cue type and reason): pt with good carryover of compensatory techniques during oral care            Upper Body Dressing Details (indicate cue type and reason): verbally reviewed brace management   Lower Body Dressing Details (indicate cue type and reason): verbally reviewed technique for LB ADL (figure 4), pt reports using this AM for socks           Tub/Shower Transfer Details (indicate cue type and reason): verbally reviewed safe transfer techniques; discussed that pt may need to sponge bathe initially until she feels strong enough to step into tub in order to ensure safety, pt verbalizing understanding  Functional mobility during ADLs: Min guard;Rolling walker General ADL Comments: pt using RW this AM given increased weakness last PM (low hgb); educated in safe RW techniques during functional tasks with pt verbalizing/return demonstrating understanding                        Cognition Arousal/Alertness: Awake/alert Behavior During Therapy: WFL for tasks assessed/performed Overall Cognitive Status: Within Functional Limits for tasks assessed                                          Exercises  Shoulder Instructions       General Comments      Pertinent Vitals/ Pain       Pain Assessment: Faces Faces Pain Scale: Hurts little more Pain Location: incisional; low back Pain Descriptors / Indicators: Operative site guarding;Discomfort;Sore;Tightness Pain Intervention(s): Limited activity within patient's tolerance;Monitored during session;Repositioned  Home Living                                          Prior Functioning/Environment              Frequency  Min 2X/week         Progress Toward Goals  OT Goals(current goals can now be found in the care plan section)  Progress towards OT goals: Progressing toward goals  Acute Rehab OT Goals Patient Stated Goal: home when able OT Goal Formulation: With patient Time For Goal Achievement: 08/09/20 Potential to Achieve Goals: Good ADL Goals Pt Will Perform Grooming: with modified independence;standing Pt Will Perform Lower Body Bathing: with modified independence;sit to/from stand Pt Will Perform Lower Body Dressing: with modified independence;sit to/from stand;with adaptive equipment Pt Will Transfer to Toilet: with modified independence;ambulating Pt Will Perform Toileting - Clothing Manipulation and hygiene: with modified independence;sit to/from stand;with adaptive equipment Pt Will Perform Tub/Shower Transfer: Tub transfer;with min guard assist;ambulating;3 in 1 Additional ADL Goal #1: Pt will perform bed mobility with mod independence as precursor to EOB/OOB ADL.  Plan Discharge plan remains appropriate    Co-evaluation                 AM-PAC OT "6 Clicks" Daily Activity     Outcome Measure   Help from another person eating meals?: None Help from another person taking care of personal grooming?: A Little Help from another person toileting, which includes using toliet, bedpan, or urinal?: A Little Help from another person bathing (including washing, rinsing, drying)?: A Little Help from another person to put on and taking off regular upper body clothing?: A Little Help from another person to put on and taking off regular lower body clothing?: A Little 6 Click Score: 19    End of Session Equipment Utilized During Treatment: Back brace;Rolling walker  OT Visit Diagnosis: Other abnormalities of gait and mobility (R26.89);Pain Pain - part of body:  (back)   Activity Tolerance Patient tolerated treatment well   Patient Left with call bell/phone within reach;Other (comment) (seated EOB )    Nurse Communication Mobility status        Time: 7106-2694 OT Time Calculation (min): 17 min  Charges: OT General Charges $OT Visit: 1 Visit OT Treatments $Self Care/Home Management : 8-22 mins  Marcy Siren, OT Acute Rehabilitation Services Pager [phone] Office [phone]   Orlando Penner 07/27/2020, 8:37 AM

## 2020-07-27 NOTE — Care Management (Signed)
Spoke w patient at bedside. She feels she will be able to recover at home. Pt has been ambulating w RW independently several times this morning. She declined referral for OP PT and states she has a connection at an OP center in [facility] if she changes her mind.  Staff to provide w RW prior to DC.

## 2020-07-27 NOTE — Anesthesia Postprocedure Evaluation (Signed)
Anesthesia Post Note  Patient: Teresa Navarro  Procedure(s) Performed: Lumbar Four-Five Lumbar Five-Sacral One Posterior Lumbar Interbody Fusion (N/A Spine Lumbar)     Patient location during evaluation: PACU Anesthesia Type: General Level of consciousness: awake and alert Pain management: pain level controlled Vital Signs Assessment: post-procedure vital signs reviewed and stable Respiratory status: spontaneous breathing, nonlabored ventilation, respiratory function stable and patient connected to nasal cannula oxygen Cardiovascular status: blood pressure returned to baseline and stable Postop Assessment: no apparent nausea or vomiting Anesthetic complications: no   No complications documented.  Last Vitals:  Vitals:   07/27/20 0508 07/27/20 0752  BP: (!) 163/79 127/71  Pulse: 72 73  Resp: 18 16  Temp: 36.9 C 37.1 C  SpO2: 99% 98%    Last Pain:  Vitals:   07/27/20 0752  TempSrc: Oral  PainSc:                  Boleslaw Borghi

## 2020-07-27 NOTE — Progress Notes (Signed)
Pt and husband given D/C instructions with verbal understanding. Rx's were sent to the pharmacy by MD. Pt's incision is clean and dry with no sign of infection. Pt's IV was removed prior to D/C. Pt received RW from [employer] per MD order. Pt D/C'd home via wheelchair per MD order. Pt is stable @ D/C and has no other needs at this time. Eliska Hamil, RN  

## 2021-01-10 IMAGING — CT CT ABD-PELV W/ CM
2 of 4 series · 16 of 46 positions shown, 18 images · IV contrast (omnipaque)
Comparison: None.

CLINICAL DATA: Right upper quadrant abdominal pain. Nausea and
vomiting.

EXAM:
CT ABDOMEN AND PELVIS WITH CONTRAST
TECHNIQUE: Multidetector CT imaging of the abdomen and pelvis was performed
using the standard protocol following bolus administration of
intravenous contrast.
CONTRAST:  100mL OMNIPAQUE IOHEXOL 300 MG/ML  SOLN

[Series 3: a/p w/ 5mm · axial · 0.98mm/px · z∈[+622,+1032]mm · 13 of 90 slices shown, 15 images]
[im 4/90  soft-tissue]
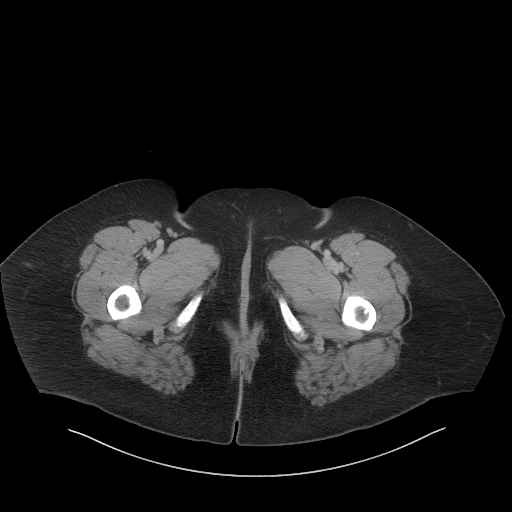
[im 4/90  bone]
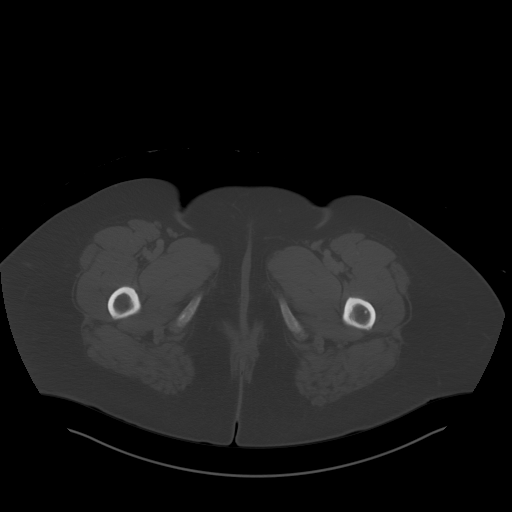
[im 12/90  soft-tissue]
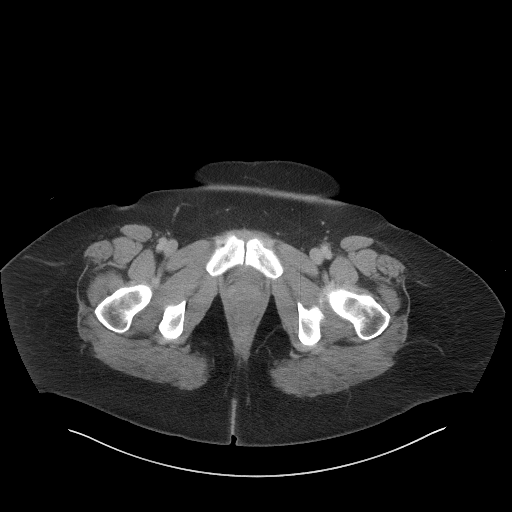
[im 20/90  soft-tissue]
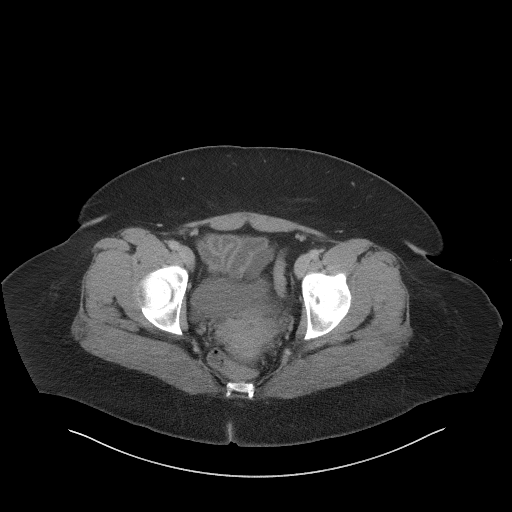
[im 24/90  soft-tissue]
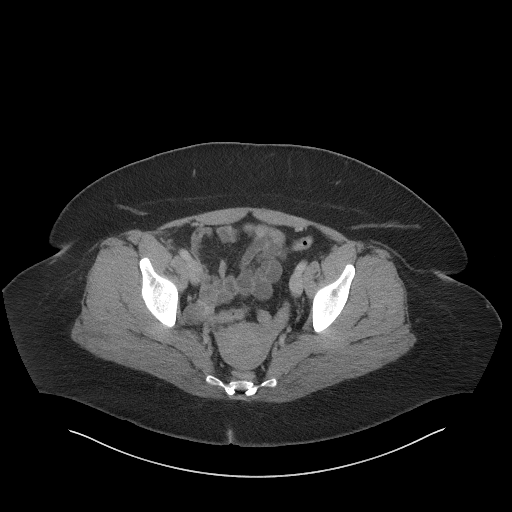
[im 31/90  soft-tissue]
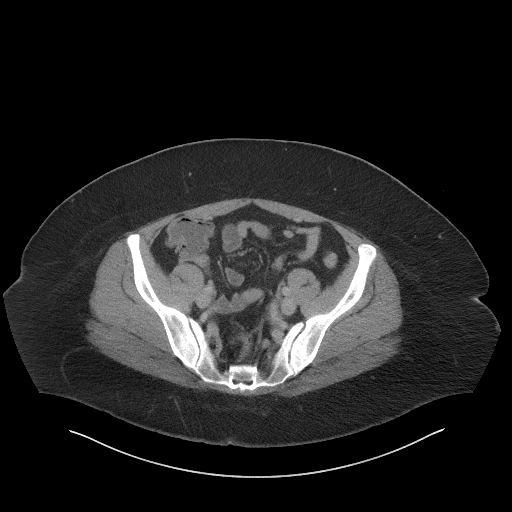
[im 39/90  soft-tissue]
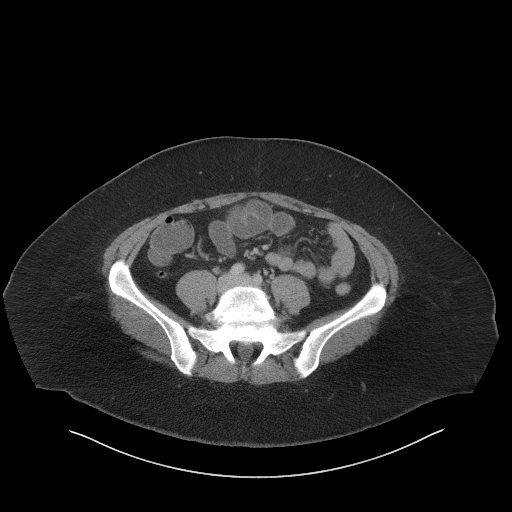
[im 47/90  soft-tissue]
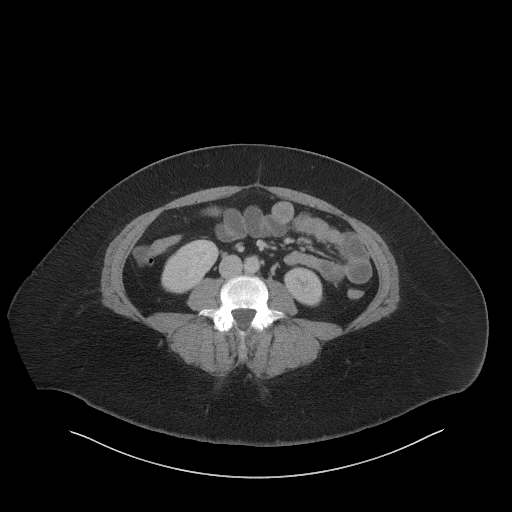
[im 51/90  soft-tissue]
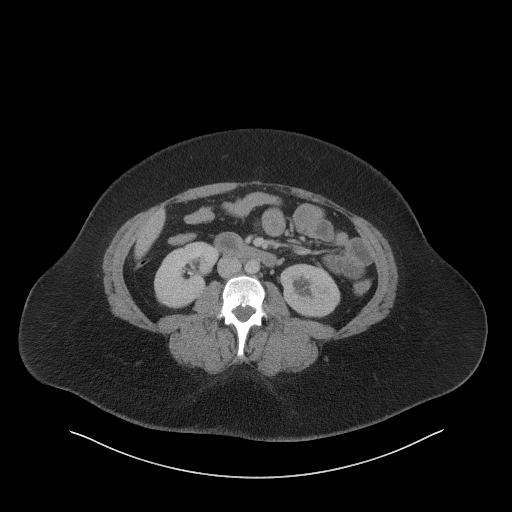
[im 59/90  soft-tissue]
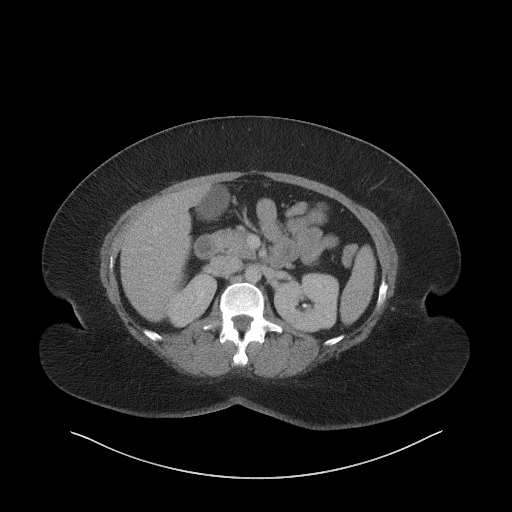
[im 59/90  bone]
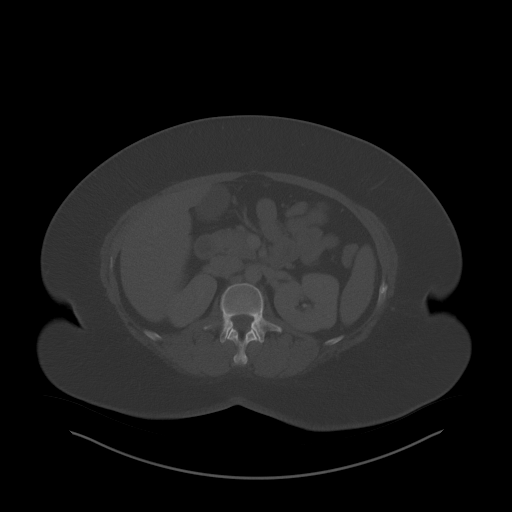
[im 66/90  soft-tissue]
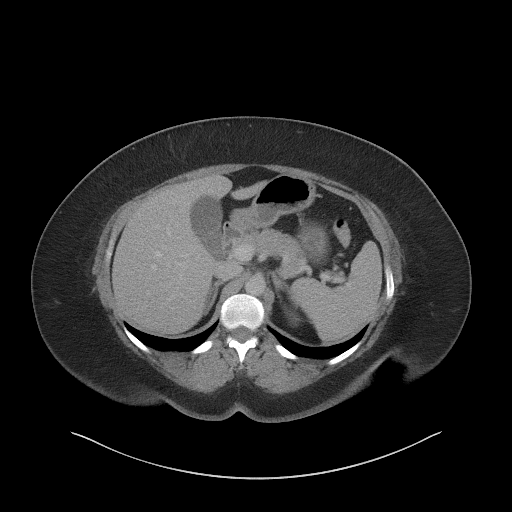
[im 70/90  soft-tissue]
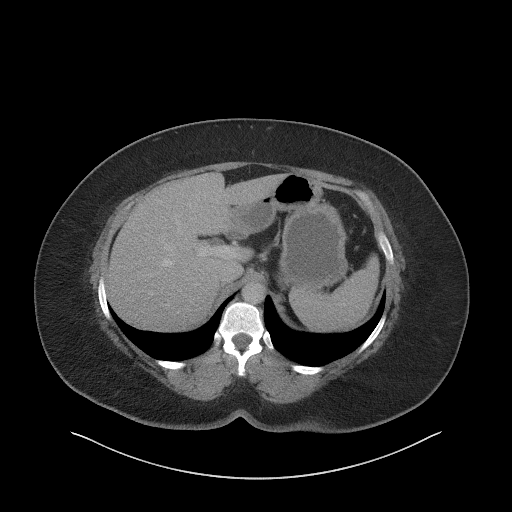
[im 78/90  soft-tissue]
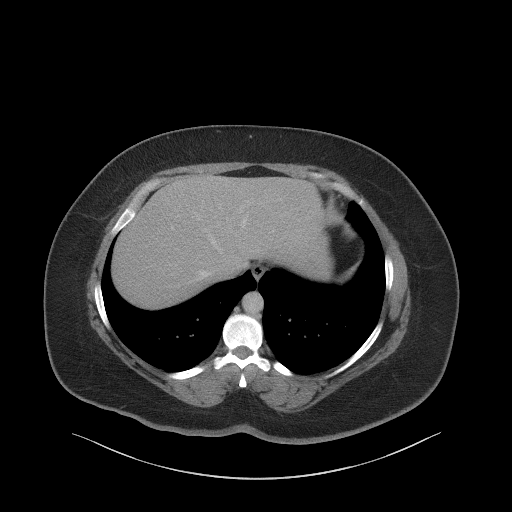
[im 86/90  soft-tissue]
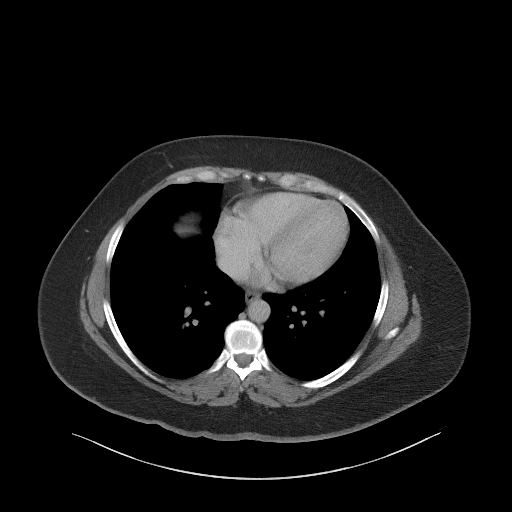

[Series 6: a/p w/ cor · coronal · 0.83mm/px · 3 of 142 slices shown]
[im 48/142  soft-tissue]
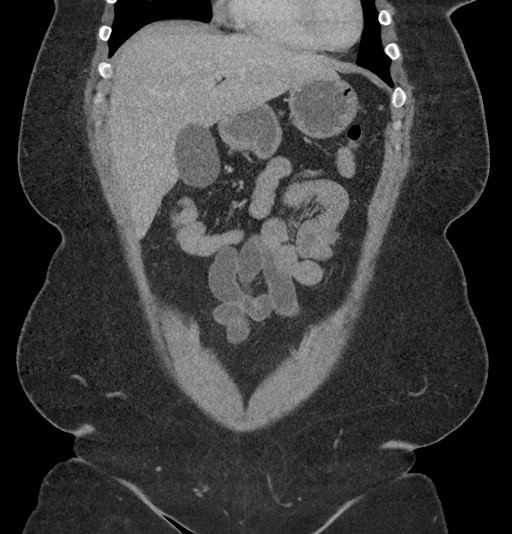
[im 63/142  soft-tissue]
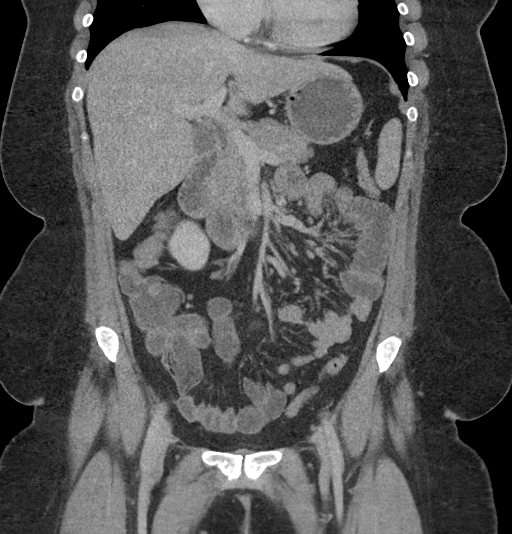
[im 79/142  soft-tissue]
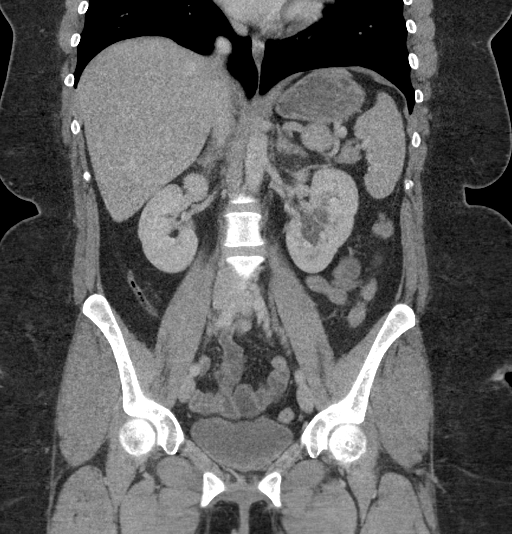

[16 of 46 positions shown; findings below may reference images not displayed]

FINDINGS: Lower chest: The lung bases are clear without focal nodule, mass, or
airspace disease. The heart size is normal. No significant pleural
or pericardial effusion is present.

Hepatobiliary: Fatty infiltration liver is evident without focal
nodule or mass. The common bile duct and gallbladder are normal.

Pancreas: Unremarkable. No pancreatic ductal dilatation or
surrounding inflammatory changes.

Spleen: Insert normal spleen

Adrenals/Urinary Tract: Adrenal glands are within normal limits
bilaterally. Central sinus cyst is present at the lower pole of the
left kidney. Kidneys and ureters are otherwise within normal limits.
The urinary bladder is normal.

Stomach/Bowel: Stomach and duodenum are within normal limits. The
small bowel is within normal limits. Terminal ileum is unremarkable.
The appendix is visualized, gas filled, and normal. The ascending
and transverse colon are within normal limits. The descending and
sigmoid colon are normal.

Vascular/Lymphatic: No significant vascular findings are present. No
enlarged abdominal or pelvic lymph nodes.

Reproductive: Uterus and bilateral adnexa are unremarkable.

Other: No abdominal wall hernia or abnormality. No abdominopelvic
ascites.

Musculoskeletal: Bilateral L5 pars defects are present. Grade 2
anterolisthesis measures 10 mm. Slight retrolisthesis is present at
L4-5. Left greater than right foraminal stenosis is present at
L5-S1. Alignment is otherwise anatomic. No focal lytic or blastic
lesions are present. The bony pelvis is within normal limits. Hips
are located and normal.
IMPRESSION: 1. No acute or focal lesion to explain the patient's symptoms.
2. Fatty infiltration liver.
3. Bilateral L5 pars defects with grade 2 anterolisthesis and left
greater than right foraminal stenosis at L5-S1.
4. Appendix is visualized and normal.

## 2021-08-08 IMAGING — RF DG LUMBAR SPINE 2-3V
1 series · 2 of 2 positions shown · non-contrast
Comparison: 06/10/2020.

CLINICAL DATA: L4-S1 fusion.

EXAM:
DG C-ARM 1-60 MIN; LUMBAR SPINE - 2-3 VIEW
FLUOROSCOPY TIME:  Fluoroscopy Time:  42.2 seconds.

[Series 1: run · 2 of 2 slices shown]
[im 1/2]
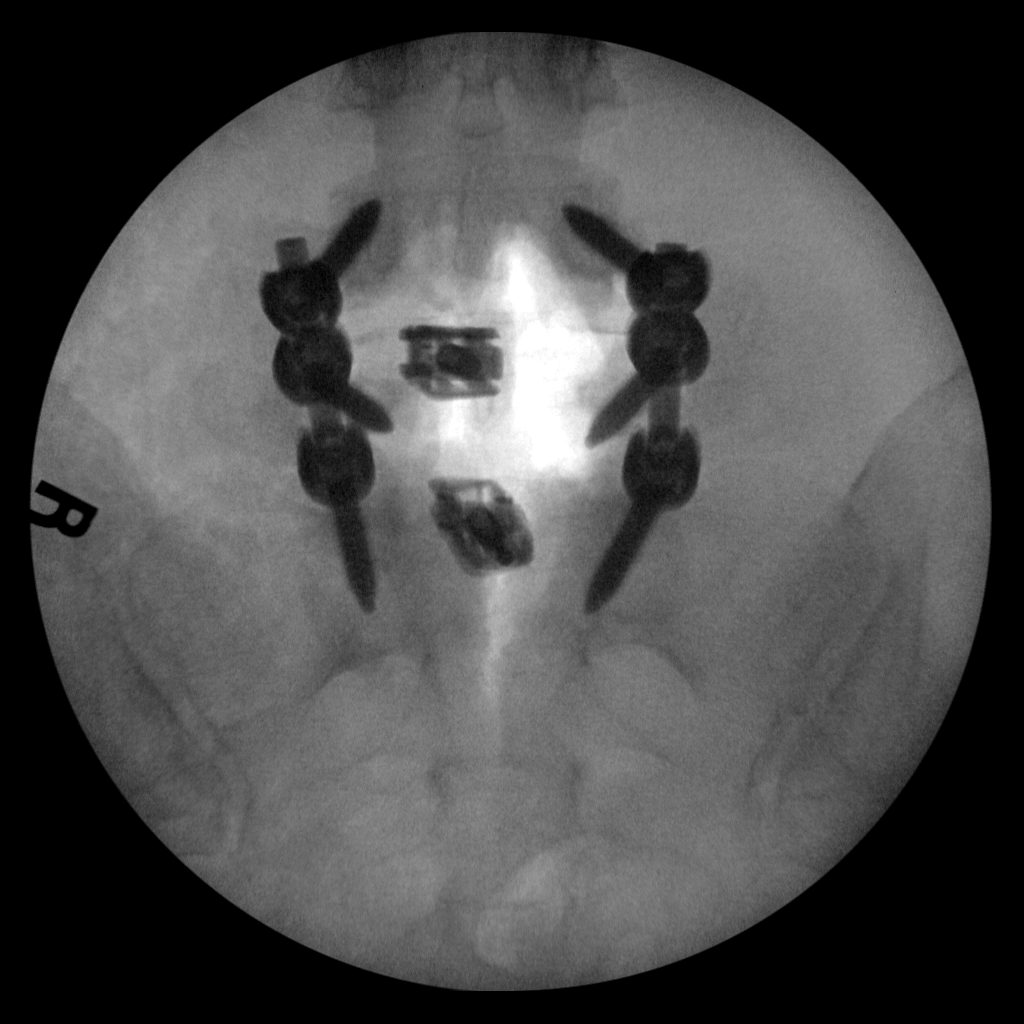
[im 2/2]
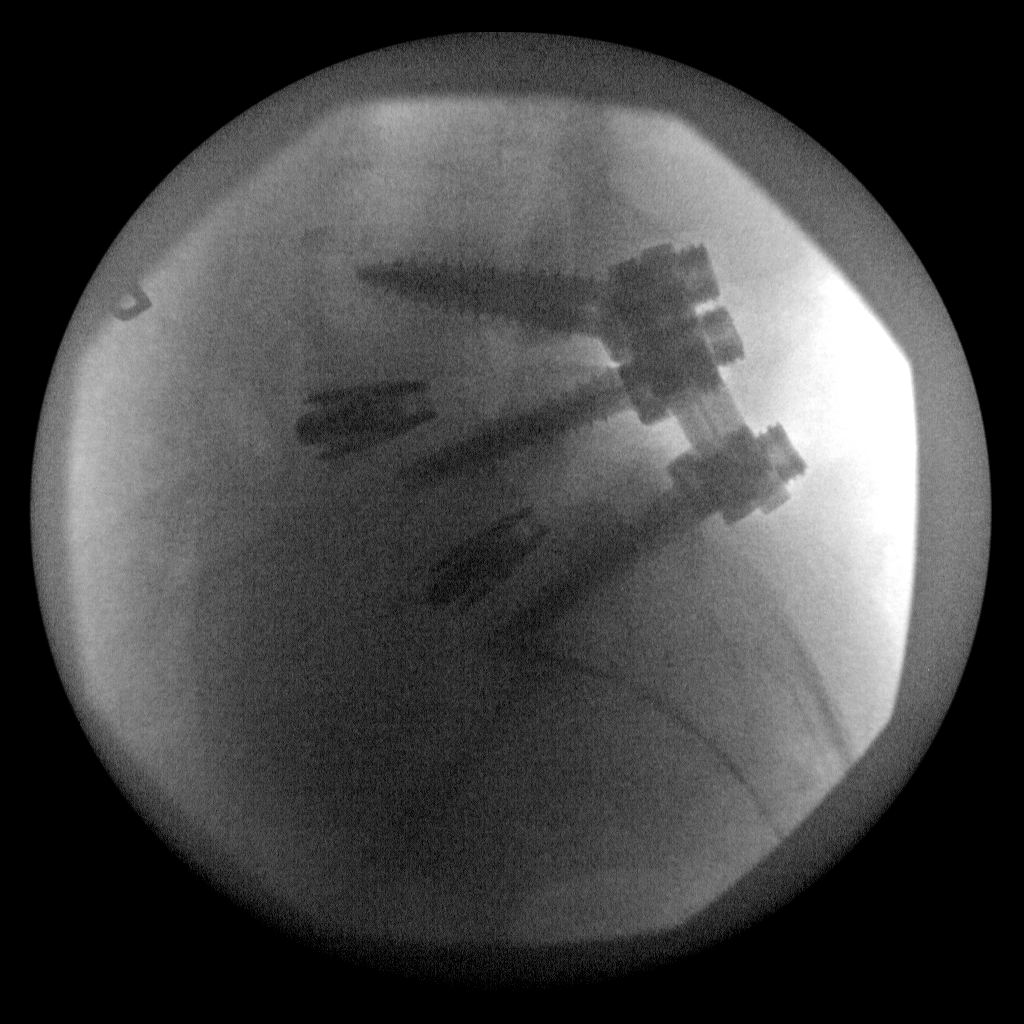

[2 of 2 positions shown; findings below may reference images not displayed]

FINDINGS: Two C-arm fluoroscopic images were obtained intraoperatively and
submitted for post operative interpretation. These images
demonstrate L4 through S1 posterior fusion with intervening
interbody spacers. Anterolisthesis of L5 on S1, better characterized
on recent dedicated lumbar radiographs. Please see the performing
provider's procedural report for further detail.
IMPRESSION: Intraoperative fluoroscopic images, as detailed above.
